# Patient Record
Sex: Female | Born: 1955 | Race: Black or African American | Hispanic: No | Smoking: Never smoker
Health system: Southern US, Community
[De-identification: ages and names within clinical notes are randomized; demographics above are authoritative.]

## PROBLEM LIST (undated history)

## (undated) DIAGNOSIS — J45909 Unspecified asthma, uncomplicated: Secondary | ICD-10-CM

## (undated) DIAGNOSIS — M1990 Unspecified osteoarthritis, unspecified site: Secondary | ICD-10-CM

## (undated) DIAGNOSIS — K5790 Diverticulosis of intestine, part unspecified, without perforation or abscess without bleeding: Secondary | ICD-10-CM

## (undated) DIAGNOSIS — K219 Gastro-esophageal reflux disease without esophagitis: Secondary | ICD-10-CM

## (undated) DIAGNOSIS — D649 Anemia, unspecified: Secondary | ICD-10-CM

## (undated) DIAGNOSIS — I10 Essential (primary) hypertension: Secondary | ICD-10-CM

## (undated) DIAGNOSIS — E785 Hyperlipidemia, unspecified: Secondary | ICD-10-CM

## (undated) HISTORY — DX: Hyperlipidemia, unspecified: E78.5

## (undated) HISTORY — DX: Unspecified osteoarthritis, unspecified site: M19.90

## (undated) HISTORY — DX: Diverticulosis of intestine, part unspecified, without perforation or abscess without bleeding: K57.90

## (undated) HISTORY — DX: Morbid (severe) obesity due to excess calories: E66.01

## (undated) HISTORY — DX: Anemia, unspecified: D64.9

## (undated) HISTORY — PX: WISDOM TOOTH EXTRACTION: SHX21

## (undated) HISTORY — DX: Gastro-esophageal reflux disease without esophagitis: K21.9

## (undated) HISTORY — PX: OTHER SURGICAL HISTORY: SHX169

---

## 1997-10-06 ENCOUNTER — Emergency Department (HOSPITAL_COMMUNITY): Admission: EM | Admit: 1997-10-06 | Discharge: 1997-10-06 | Payer: Self-pay | Admitting: Emergency Medicine

## 1997-12-07 ENCOUNTER — Encounter: Payer: Self-pay | Admitting: Internal Medicine

## 1997-12-07 ENCOUNTER — Emergency Department (HOSPITAL_COMMUNITY): Admission: EM | Admit: 1997-12-07 | Discharge: 1997-12-07 | Payer: Self-pay | Admitting: Internal Medicine

## 1997-12-08 ENCOUNTER — Ambulatory Visit (HOSPITAL_COMMUNITY): Admission: RE | Admit: 1997-12-08 | Discharge: 1997-12-08 | Payer: Self-pay | Admitting: Emergency Medicine

## 1997-12-08 ENCOUNTER — Encounter: Payer: Self-pay | Admitting: Emergency Medicine

## 1998-05-29 ENCOUNTER — Emergency Department (HOSPITAL_COMMUNITY): Admission: EM | Admit: 1998-05-29 | Discharge: 1998-05-29 | Payer: Self-pay | Admitting: Emergency Medicine

## 1999-08-20 ENCOUNTER — Emergency Department (HOSPITAL_COMMUNITY): Admission: EM | Admit: 1999-08-20 | Discharge: 1999-08-20 | Payer: Self-pay | Admitting: Emergency Medicine

## 1999-08-30 ENCOUNTER — Emergency Department (HOSPITAL_COMMUNITY): Admission: EM | Admit: 1999-08-30 | Discharge: 1999-08-30 | Payer: Self-pay | Admitting: Emergency Medicine

## 1999-10-19 ENCOUNTER — Emergency Department (HOSPITAL_COMMUNITY): Admission: EM | Admit: 1999-10-19 | Discharge: 1999-10-19 | Payer: Self-pay | Admitting: Emergency Medicine

## 1999-11-03 ENCOUNTER — Emergency Department (HOSPITAL_COMMUNITY): Admission: EM | Admit: 1999-11-03 | Discharge: 1999-11-04 | Payer: Self-pay | Admitting: Emergency Medicine

## 2000-05-07 ENCOUNTER — Encounter: Payer: Self-pay | Admitting: Emergency Medicine

## 2000-05-07 ENCOUNTER — Emergency Department (HOSPITAL_COMMUNITY): Admission: EM | Admit: 2000-05-07 | Discharge: 2000-05-07 | Payer: Self-pay | Admitting: Emergency Medicine

## 2000-05-08 ENCOUNTER — Emergency Department (HOSPITAL_COMMUNITY): Admission: EM | Admit: 2000-05-08 | Discharge: 2000-05-08 | Payer: Self-pay | Admitting: Emergency Medicine

## 2001-05-15 ENCOUNTER — Emergency Department (HOSPITAL_COMMUNITY): Admission: EM | Admit: 2001-05-15 | Discharge: 2001-05-15 | Payer: Self-pay | Admitting: Emergency Medicine

## 2001-05-15 ENCOUNTER — Encounter: Payer: Self-pay | Admitting: Emergency Medicine

## 2001-07-16 ENCOUNTER — Emergency Department (HOSPITAL_COMMUNITY): Admission: EM | Admit: 2001-07-16 | Discharge: 2001-07-16 | Payer: Self-pay

## 2001-07-16 ENCOUNTER — Encounter: Payer: Self-pay | Admitting: Emergency Medicine

## 2001-08-24 ENCOUNTER — Emergency Department (HOSPITAL_COMMUNITY): Admission: EM | Admit: 2001-08-24 | Discharge: 2001-08-24 | Payer: Self-pay | Admitting: Emergency Medicine

## 2003-03-10 ENCOUNTER — Emergency Department (HOSPITAL_COMMUNITY): Admission: EM | Admit: 2003-03-10 | Discharge: 2003-03-10 | Payer: Self-pay | Admitting: *Deleted

## 2003-07-23 ENCOUNTER — Emergency Department (HOSPITAL_COMMUNITY): Admission: EM | Admit: 2003-07-23 | Discharge: 2003-07-23 | Payer: Self-pay | Admitting: Family Medicine

## 2004-08-01 ENCOUNTER — Ambulatory Visit (HOSPITAL_COMMUNITY): Admission: RE | Admit: 2004-08-01 | Discharge: 2004-08-01 | Payer: Self-pay | Admitting: *Deleted

## 2004-08-01 ENCOUNTER — Encounter (INDEPENDENT_AMBULATORY_CARE_PROVIDER_SITE_OTHER): Payer: Self-pay | Admitting: Specialist

## 2004-10-03 ENCOUNTER — Inpatient Hospital Stay (HOSPITAL_COMMUNITY): Admission: EM | Admit: 2004-10-03 | Discharge: 2004-10-08 | Payer: Self-pay | Admitting: Emergency Medicine

## 2004-12-19 ENCOUNTER — Emergency Department (HOSPITAL_COMMUNITY): Admission: EM | Admit: 2004-12-19 | Discharge: 2004-12-20 | Payer: Self-pay | Admitting: Emergency Medicine

## 2005-03-29 ENCOUNTER — Emergency Department (HOSPITAL_COMMUNITY): Admission: EM | Admit: 2005-03-29 | Discharge: 2005-03-29 | Payer: Self-pay | Admitting: Family Medicine

## 2005-06-30 ENCOUNTER — Emergency Department (HOSPITAL_COMMUNITY): Admission: EM | Admit: 2005-06-30 | Discharge: 2005-06-30 | Payer: Self-pay | Admitting: Emergency Medicine

## 2005-07-01 ENCOUNTER — Emergency Department (HOSPITAL_COMMUNITY): Admission: EM | Admit: 2005-07-01 | Discharge: 2005-07-01 | Payer: Self-pay | Admitting: Emergency Medicine

## 2005-07-10 ENCOUNTER — Emergency Department (HOSPITAL_COMMUNITY): Admission: EM | Admit: 2005-07-10 | Discharge: 2005-07-10 | Payer: Self-pay | Admitting: Family Medicine

## 2006-08-10 ENCOUNTER — Emergency Department (HOSPITAL_COMMUNITY): Admission: EM | Admit: 2006-08-10 | Discharge: 2006-08-10 | Payer: Self-pay | Admitting: Emergency Medicine

## 2006-08-11 ENCOUNTER — Emergency Department (HOSPITAL_COMMUNITY): Admission: EM | Admit: 2006-08-11 | Discharge: 2006-08-11 | Payer: Self-pay | Admitting: Emergency Medicine

## 2007-03-29 ENCOUNTER — Emergency Department (HOSPITAL_COMMUNITY): Admission: EM | Admit: 2007-03-29 | Discharge: 2007-03-29 | Payer: Self-pay | Admitting: Emergency Medicine

## 2007-06-25 ENCOUNTER — Emergency Department (HOSPITAL_COMMUNITY): Admission: EM | Admit: 2007-06-25 | Discharge: 2007-06-25 | Payer: Self-pay | Admitting: Emergency Medicine

## 2007-07-23 ENCOUNTER — Emergency Department (HOSPITAL_COMMUNITY): Admission: EM | Admit: 2007-07-23 | Discharge: 2007-07-24 | Payer: Self-pay | Admitting: Family Medicine

## 2009-01-05 ENCOUNTER — Emergency Department (HOSPITAL_COMMUNITY): Admission: EM | Admit: 2009-01-05 | Discharge: 2009-01-05 | Payer: Self-pay | Admitting: Emergency Medicine

## 2009-08-07 ENCOUNTER — Observation Stay (HOSPITAL_COMMUNITY): Admission: EM | Admit: 2009-08-07 | Discharge: 2009-08-08 | Payer: Self-pay | Admitting: Emergency Medicine

## 2010-02-06 ENCOUNTER — Emergency Department (HOSPITAL_COMMUNITY): Admission: EM | Admit: 2010-02-06 | Discharge: 2010-02-06 | Payer: Self-pay | Admitting: Family Medicine

## 2010-04-09 ENCOUNTER — Encounter: Payer: Self-pay | Admitting: *Deleted

## 2010-05-15 ENCOUNTER — Emergency Department (HOSPITAL_COMMUNITY)
Admission: EM | Admit: 2010-05-15 | Discharge: 2010-05-15 | Disposition: A | Discharge status: Home / Self Care | Payer: Self-pay | Attending: Emergency Medicine | Admitting: Emergency Medicine

## 2010-05-15 DIAGNOSIS — K089 Disorder of teeth and supporting structures, unspecified: Secondary | ICD-10-CM | POA: Insufficient documentation

## 2010-05-15 DIAGNOSIS — K219 Gastro-esophageal reflux disease without esophagitis: Secondary | ICD-10-CM | POA: Insufficient documentation

## 2010-05-15 DIAGNOSIS — K029 Dental caries, unspecified: Secondary | ICD-10-CM | POA: Insufficient documentation

## 2010-05-15 DIAGNOSIS — M549 Dorsalgia, unspecified: Secondary | ICD-10-CM | POA: Insufficient documentation

## 2010-05-15 DIAGNOSIS — G8929 Other chronic pain: Secondary | ICD-10-CM | POA: Insufficient documentation

## 2010-06-04 LAB — DIFFERENTIAL
Basophils Absolute: 0 10*3/uL (ref 0.0–0.1)
Basophils Relative: 0 % (ref 0–1)
Eosinophils Absolute: 0.1 10*3/uL (ref 0.0–0.7)
Eosinophils Relative: 3 % (ref 0–5)
Lymphocytes Relative: 30 % (ref 12–46)
Lymphs Abs: 1.5 10*3/uL (ref 0.7–4.0)
Monocytes Absolute: 0.3 10*3/uL (ref 0.1–1.0)
Monocytes Relative: 6 % (ref 3–12)
Neutro Abs: 2.9 10*3/uL (ref 1.7–7.7)
Neutrophils Relative %: 60 % (ref 43–77)

## 2010-06-04 LAB — URINALYSIS, ROUTINE W REFLEX MICROSCOPIC
Bilirubin Urine: NEGATIVE
Glucose, UA: 100 mg/dL — AB
Hgb urine dipstick: NEGATIVE
Ketones, ur: NEGATIVE mg/dL
Nitrite: NEGATIVE
Protein, ur: NEGATIVE mg/dL
Specific Gravity, Urine: 1.02 (ref 1.005–1.030)
Urobilinogen, UA: 0.2 mg/dL (ref 0.0–1.0)
pH: 8 (ref 5.0–8.0)

## 2010-06-04 LAB — POCT I-STAT, CHEM 8
BUN: 13 mg/dL (ref 6–23)
Calcium, Ion: 1.19 mmol/L (ref 1.12–1.32)
Chloride: 110 mEq/L (ref 96–112)
Creatinine, Ser: 0.7 mg/dL (ref 0.4–1.2)
Glucose, Bld: 56 mg/dL — ABNORMAL LOW (ref 70–99)
HCT: 34 % — ABNORMAL LOW (ref 36.0–46.0)
Hemoglobin: 11.6 g/dL — ABNORMAL LOW (ref 12.0–15.0)
Potassium: 3.6 mEq/L (ref 3.5–5.1)
Sodium: 141 mEq/L (ref 135–145)
TCO2: 22 mmol/L (ref 0–100)

## 2010-06-04 LAB — CBC
HCT: 34.3 % — ABNORMAL LOW (ref 36.0–46.0)
Hemoglobin: 10.9 g/dL — ABNORMAL LOW (ref 12.0–15.0)
MCHC: 31.8 g/dL (ref 30.0–36.0)
MCV: 86.5 fL (ref 78.0–100.0)
Platelets: 225 10*3/uL (ref 150–400)
Platelets: 254 10*3/uL (ref 150–400)
RBC: 3.97 MIL/uL (ref 3.87–5.11)
RDW: 14.3 % (ref 11.5–15.5)
RDW: 14.4 % (ref 11.5–15.5)
WBC: 4.2 10*3/uL (ref 4.0–10.5)
WBC: 4.9 10*3/uL (ref 4.0–10.5)

## 2010-06-04 LAB — BASIC METABOLIC PANEL
BUN: 9 mg/dL (ref 6–23)
Creatinine, Ser: 0.82 mg/dL (ref 0.4–1.2)
GFR calc non Af Amer: >60 mL/min (ref >60)

## 2010-06-04 LAB — GLUCOSE, CAPILLARY
Glucose-Capillary: 101 mg/dL — ABNORMAL HIGH (ref 70–99)
Glucose-Capillary: 77 mg/dL (ref 70–99)
Glucose-Capillary: 88 mg/dL (ref 70–99)
Glucose-Capillary: 97 mg/dL (ref 70–99)

## 2010-06-04 LAB — RAPID URINE DRUG SCREEN, HOSP PERFORMED
Amphetamines: NOT DETECTED
Barbiturates: NOT DETECTED
Benzodiazepines: NOT DETECTED
Cocaine: NOT DETECTED
Opiates: NOT DETECTED
Tetrahydrocannabinol: NOT DETECTED

## 2010-06-04 LAB — ETHANOL: Alcohol, Ethyl (B): <5 mg/dL (ref 0–10)

## 2010-06-04 LAB — D-DIMER, QUANTITATIVE: D-Dimer, Quant: 0.24 ug/mL-FEU (ref 0.00–0.48)

## 2010-06-04 LAB — T4, FREE: Free T4: 0.73 ng/dL — ABNORMAL LOW (ref 0.80–1.80)

## 2010-08-03 NOTE — Discharge Summary (Signed)
[name], DEA NO.:  [id number]   MEDICAL RECORD NO.:  [id number]          PATIENT TYPE:  INP   LOCATION:  1619                         FACILITY:  [facility]   PHYSICIAN:  Hollice Espy, M.D.DATE OF BIRTH:  [birth year]   DATE OF ADMISSION:  10/02/2004  DATE OF DISCHARGE:  10/08/2004                                 DISCHARGE SUMMARY   DISCHARGE DIAGNOSES:  1.  Diverticulitis.  2.  History of sleep apnea.  3.  Vaginal spotting with questionable bilateral hydrosalpinx, left greater      than right.   Patient's PCP is at [facility]; however, she plans on changing doctors to  an insurance-based primary care physician.   HOSPITAL COURSE:  Patient is a 55 year old African-American female with a  past medical history of diverticulosis and diverticulitis, who presents to  the emergency room on October 02, 2004 with abdominal pain.  A CT of the  abdomen and pelvis showed sigmoid colon diverticulitis with diffuse low-  density wall thickening and stranding consistent with that.  In addition, in  the anterior aspect of the sigmoid colon, a 1.5 x 0.9 collection of fluid  plus air or gas was noted, left greater than right hydrosalpinx were noted  as well.  A follow-up transvaginal ultrasound was recommended once the  patient's GI issues had resolved.  The patient was made n.p.o., started on  IV Cipro and Flagyl.  She tolerated this well.  Initially had been having  lots of abdominal pain and distention but after placement of a rectal tube,  this greatly improved her distention and symptoms.  She was made n.p.o., and  her pain continued to decrease.  Her white count remained stable throughout  her entire hospitalization.   On October 05, 2004, the patient was feeling better.  CT of the abdomen and  pelvis was done, which showed improved diverticulitis with abscess.  She was  started on clear liquids on the evening of July 22.  She tried to advance to  solid foods on July  23 but started having some abdominal pain that was  relieved with Maalox.  By July 24 a.m., she was tolerating solid food  without complaint.  The plan would be to discharge her to home.   DISPOSITION:  Improved.   DISCHARGE DIET:  Regular diet.   Continue on eight more days of Cipro and Flagyl for a total of 14 days of  both.  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  In regards to her GU issues, she was  complaining of some abdominal distention initially, which she thought may  also be related to her bladder.  She had a Foley catheter in, which did help  some.  She had no complaints of dysuria but was concerned about uterine  fibroids.  CT of the abdomen and pelvis was done, as  mentioned in the initial presentation, which she noted some bilateral  hydrosalpinx.  Given the patient's continued diverticulitis  and the fact  that her pain had not resolved, we will plan to complete a full course of  antibiotics before followup with GU and an outpatient transvaginal  ultrasound.  The patient is okay to return to work.       SKK/MEDQ  D:  10/08/2004  T:  10/08/2004  Job:  [id number]   cc:   Althea Grimmer. Luther Parody, M.D.  [address], [address]  [place]  [place] [address]  Fax: [phone]   [facility]

## 2010-08-03 NOTE — H&P (Signed)
Olivia Gutierrez, BARUA NO.:  [id number]   MEDICAL RECORD NO.:  [id number]          PATIENT TYPE:  INP   LOCATION:  0108                         FACILITY:  [facility]   PHYSICIAN:  Melissa L. Ladona Ridgel, MD  DATE OF BIRTH:  1955/09/07   DATE OF ADMISSION:  10/02/2004  DATE OF DISCHARGE:                                HISTORY & PHYSICAL   CHIEF COMPLAINT:  Four days of abdominal pain and vaginal pain.   PRIMARY CARE PHYSICIAN:  [facility].   HISTORY OF PRESENT ILLNESS:  The patient is a 55 year old African-American  female who developed bloating 4 days ago which progressed on to not being  able to move her bowels, mild dysuria, followed then by some vaginal  spotting, which has not occurred to her for many years.  The patient states  that her last menstrual cycle was at the age of 55, and since then has been  told that she has a hormone problem and has not cycled nor seen a  gynecologist in greater than 12 years.  The patient states that she has  subsequently been unable to move her bowels, and the pain in her abdomen has  been getting progressively worse.  It is bilaterally located in the lower  quadrants and in the suprapubic area, and generally at baseline it is 8/10;  currently it has been relieved by pain medication, and is down to a 1-2/10,  but she still feels quite bloated.  The patient states that in response to  her symptoms, she was watching her progression very closely.  She states she  took a laxative, namely milk of magnesia, which generally resulted in nausea  and vomiting.  Today is the first day that she actually moved her bowels a  very small amount.  She denied any melena or hematochezia.   REVIEW OF SYSTEMS:  She did have a low-grade temperature, positive dysuria,  no hematemesis, no melena.  Her appetite up until this time has been good.  She has had no weight loss or weight gain.  All other review of systems  appear negative.   PAST MEDICAL  HISTORY:  She had a previous bout of diverticulitis 4 months  ago.  She was seen by Dr. Luther Parody in May, and had an EGD, colonoscopy.  Biopsies were negative for any abnormality.  A full report is not available  at this time.  The patient states, again, she stopped her menses at 20, and  has not had any subsequent GYN follow up in approximately 12 years.   PAST SURGICAL HISTORY:  None.   ALLERGIES:  PENICILLIN which caused hives.   SOCIAL HISTORY:  She denies tobacco.  She has occasional wine.  She is a  caregiver for the disabled.   FAMILY HISTORY:  Mom is deceased.  Dad is still living.  Both sides of their  family have hypertension and CAD.  Her sister is deceased secondary to  hepatitis, cirrhosis, and cancer of the liver.   MEDICATIONS AT THIS TIME:  None.   PHYSICAL EXAMINATION:  VITAL SIGNS:  Temperature is 98.3, blood pressure  111/64, pulse 64, respirations 12, saturation 95%.  GENERAL:  She is in no acute distress.  She is an obese African-American  female.  HEENT:  Normocephalic and atraumatic.  Pupils equal, round and reactive to  light.  Extraocular muscles are intact.  Mucous membranes are moist.  NECK:  Supple.  There is no JVD, no lymph nodes, no carotid bruits.  She has  mild bilateral exophthalmos.  She is anicteric.  CHEST:  Clear to auscultation, but decreased bilaterally.  CARDIOVASCULAR:  Regular rate and rhythm.  Positive S1 and S2.  No S3 or S4.  Normal murmurs, rubs, or gallops.  ABDOMEN:  Soft.  Positive tenderness diffusely, but with concentration in  the left lower quadrant, there is no guarding or rebound.  EXTREMITIES:  No clubbing, cyanosis, or edema.  NEUROLOGIC:  She is awake, alert, oriented x3.  Cranial nerves II-XII are  intact.  Power is 5/5.  Deep tendon reflexes are 2.   LABORATORY DATA:  White count of 8.9, hemoglobin 11.4, hematocrit 33.0,  platelets 286.  Sodium is 139, potassium 4.3, chloride 107, CO2 of 26, BUN  11, creatinine 1.0,  glucose is 103.  Urinalysis is negative.  Wet prep  showed no yeast, no Trichomonas, and no clue cells.  Urine pregnancy is  negative.  LFT's are within normal limits.  CT of the abdomen reveals  colonic diverticulosis without diverticulitis.  She has a large sigmoid  colon diverticula with low density wall thickening and stranding consistent  with diverticulitis.  The anterior aspect of the sigmoid colon reveals a 1.5  x 0.9 cm collection of fluid which contains either air or gas.  There is a  left greater than right hydrosalpinx.  Follow up vaginal ultrasound is  warranted.   ASSESSMENT AND PLAN:  This is a 55 year old African-American female with  recurrent sigmoid diverticulitis with contained perforation.  Will admit her  for antibiotics, IV hydration, and possible surgical consultation in the  a.m.  1.  GASTROINTESTINAL:  Diverticulitis with small contained      abscess/perforation.  I agree with continuation of Cipro and Flagyl.      Will do pain control with Dilaudid.  Will hydrate her gently.  DrMarland Kitchen      Luther Parody is listed as having done her colonoscopy and EGD in May.  We      will attempt to obtain that information in the morning.  2.  GYNECOLOGIC:  Urinalysis is negative.  She has had mild spotting.  Her      vaginal exam appears negative at this time.  We will recommend a vaginal      ultrasound when she is more stable.  She also needs GYN follow up.  3.  ENDOCRINOLOGY.  She has mild exophthalmos.  Will check a TSH.  4.  PULMONARY:  Stable with no issues.  5.  CARDIOVASCULAR:  Stable.  We will gently hydrate her.       MLT/MEDQ  D:  10/03/2004  T:  10/03/2004  Job:  [id number]   cc:   Donia Guiles, M.D.  [facility]   Althea Grimmer. Luther Parody, M.D.  [address]  [place]  [place] [address]  Fax: [phone]

## 2010-08-17 ENCOUNTER — Emergency Department (HOSPITAL_COMMUNITY)
Admission: EM | Admit: 2010-08-17 | Discharge: 2010-08-17 | Disposition: A | Discharge status: Home / Self Care | Payer: Self-pay | Attending: Emergency Medicine | Admitting: Emergency Medicine

## 2010-08-17 DIAGNOSIS — K089 Disorder of teeth and supporting structures, unspecified: Secondary | ICD-10-CM | POA: Insufficient documentation

## 2010-08-17 DIAGNOSIS — K047 Periapical abscess without sinus: Secondary | ICD-10-CM | POA: Insufficient documentation

## 2010-08-17 DIAGNOSIS — R51 Headache: Secondary | ICD-10-CM | POA: Insufficient documentation

## 2010-12-11 LAB — URINALYSIS, ROUTINE W REFLEX MICROSCOPIC
Bilirubin Urine: NEGATIVE
Glucose, UA: NEGATIVE
Ketones, ur: NEGATIVE
Leukocytes, UA: NEGATIVE
Specific Gravity, Urine: 1.025
pH: 6

## 2010-12-11 LAB — DIFFERENTIAL
Basophils Absolute: 0
Eosinophils Relative: 4
Lymphocytes Relative: 21
Lymphs Abs: 1.2
Neutro Abs: 4.1
Neutrophils Relative %: 72

## 2010-12-11 LAB — URINE MICROSCOPIC-ADD ON

## 2010-12-11 LAB — BASIC METABOLIC PANEL
BUN: 14
Calcium: 9.2
GFR calc non Af Amer: >60
Potassium: 4.3

## 2010-12-11 LAB — CBC
HCT: 33.2 — ABNORMAL LOW
Platelets: 274
RDW: 14.7
WBC: 5.7

## 2010-12-12 LAB — COMPREHENSIVE METABOLIC PANEL
Albumin: 3.5
Alkaline Phosphatase: 83
BUN: 12
CO2: 25
Chloride: 101
Creatinine, Ser: 0.83
GFR calc non Af Amer: >60
Potassium: 4.2
Total Bilirubin: 0.4

## 2010-12-12 LAB — DIFFERENTIAL
Basophils Absolute: 0
Basophils Relative: 0
Eosinophils Relative: 4
Lymphocytes Relative: 25
Monocytes Absolute: 0.4
Neutro Abs: 4.7

## 2010-12-12 LAB — CBC
HCT: 35.2 — ABNORMAL LOW
Hemoglobin: 11.7 — ABNORMAL LOW
MCV: 85.6
RBC: 4.11
WBC: 7.2

## 2010-12-12 LAB — URINALYSIS, ROUTINE W REFLEX MICROSCOPIC
Bilirubin Urine: NEGATIVE
Ketones, ur: NEGATIVE
Nitrite: NEGATIVE
Specific Gravity, Urine: 1.027
Urobilinogen, UA: 0.2
pH: 5.5

## 2011-01-23 ENCOUNTER — Emergency Department (INDEPENDENT_AMBULATORY_CARE_PROVIDER_SITE_OTHER)
Admission: EM | Admit: 2011-01-23 | Discharge: 2011-01-23 | Disposition: A | Discharge status: Home / Self Care | Payer: PRIVATE HEALTH INSURANCE | Source: Home / Self Care | Attending: Family Medicine | Admitting: Family Medicine

## 2011-01-23 DIAGNOSIS — S39012A Strain of muscle, fascia and tendon of lower back, initial encounter: Secondary | ICD-10-CM

## 2011-01-23 DIAGNOSIS — M533 Sacrococcygeal disorders, not elsewhere classified: Secondary | ICD-10-CM

## 2011-01-23 DIAGNOSIS — M545 Low back pain, unspecified: Secondary | ICD-10-CM

## 2011-01-23 DIAGNOSIS — IMO0002 Reserved for concepts with insufficient information to code with codable children: Secondary | ICD-10-CM

## 2011-01-23 MED ORDER — CYCLOBENZAPRINE HCL 5 MG PO TABS: 5.0000 mg | ORAL_TABLET | Freq: Three times a day (TID) | ORAL | Status: AC | PRN

## 2011-01-23 MED ORDER — PREDNISONE (PAK) 10 MG PO TABS: ORAL_TABLET | ORAL | Status: DC

## 2011-01-23 MED ORDER — HYDROCODONE-ACETAMINOPHEN 5-325 MG PO TABS: ORAL_TABLET | ORAL | Status: AC

## 2011-01-23 NOTE — ED Notes (Signed)
Pt states she has been trying to work out some, lifting weights, and 2 days ago, she had some a low level lift  While helping someone w their bath, when she felt something "snap" since then , she has had a lot of problems w pain in her tailbone area w radiation into both of her  buttocks cheeks; the only thing that helps her is to not move at all

## 2011-01-23 NOTE — ED Provider Notes (Signed)
History     CSN: [id number] Arrival date & time: 01/23/2011  1:46 PM   First MD Initiated Contact with Patient 01/23/11 1350      Chief Complaint  Patient presents with  . Back Pain    (Consider location/radiation/quality/duration/timing/severity/associated sxs/prior treatment) Patient is a 55 y.o. female presenting with back pain. The history is provided by the patient.  Back Pain  This is a new problem. The current episode started more than 2 days ago. The problem occurs constantly. The problem has not changed since onset.The pain is associated with lifting heavy objects. The pain is present in the lumbar spine and sacro-iliac joint. The quality of the pain is described as shooting. The pain does not radiate. Pertinent negatives include no abdominal pain, no bowel incontinence, no bladder incontinence and no paresthesias. She has tried analgesics, NSAIDs and bed rest for the symptoms. The treatment provided no relief. Risk factors include obesity and a sedentary lifestyle.    History reviewed. No pertinent past medical history.  Past Surgical History  Procedure Date  . None     History reviewed. No pertinent family history.  History  Substance Use Topics  . Smoking status: Never Smoker   . Smokeless tobacco: Not on file  . Alcohol Use: No    OB History    Grav Para Term Preterm Abortions TAB SAB Ect Mult Living                  Review of Systems  Constitutional: Negative.   HENT: Negative.   Eyes: Negative.   Gastrointestinal: Negative for abdominal pain and bowel incontinence.  Genitourinary: Negative.  Negative for bladder incontinence.  Musculoskeletal: Positive for back pain.  Neurological: Negative.  Negative for paresthesias.    Allergies  Penicillins  Home Medications  No current outpatient prescriptions on file.  BP 147/79  Pulse 52  Temp(Src) 98 F (36.7 C) (Oral)  Resp 16  SpO2 100%  Physical Exam  Constitutional: She appears well-developed  and well-nourished.  HENT:  Head: Normocephalic and atraumatic.  Musculoskeletal:       Lumbar back: She exhibits decreased range of motion, tenderness and pain. She exhibits no bony tenderness.       Back: negative straight leg raise bilaterally, negative Fabers bilaterally, no pain with internal or external rotation bilaterally, full flexion, extension, abduction, and adduction; 5/5 strength    ED Course  Procedures (including critical care time)  Labs Reviewed - No data to display No results found.   No diagnosis found.    MDM  SI joint pain, low back pain, muscle strain        Richardo Priest, MD 01/23/11 1506

## 2011-12-12 ENCOUNTER — Emergency Department (INDEPENDENT_AMBULATORY_CARE_PROVIDER_SITE_OTHER)
Admission: EM | Admit: 2011-12-12 | Discharge: 2011-12-12 | Disposition: A | Discharge status: Home / Self Care | Payer: Self-pay | Source: Home / Self Care | Attending: Emergency Medicine | Admitting: Emergency Medicine

## 2011-12-12 ENCOUNTER — Emergency Department (INDEPENDENT_AMBULATORY_CARE_PROVIDER_SITE_OTHER): Payer: Self-pay

## 2011-12-12 ENCOUNTER — Encounter (HOSPITAL_COMMUNITY): Payer: Self-pay | Admitting: *Deleted

## 2011-12-12 DIAGNOSIS — J209 Acute bronchitis, unspecified: Secondary | ICD-10-CM

## 2011-12-12 DIAGNOSIS — J45909 Unspecified asthma, uncomplicated: Secondary | ICD-10-CM

## 2011-12-12 DIAGNOSIS — I10 Essential (primary) hypertension: Secondary | ICD-10-CM

## 2011-12-12 DIAGNOSIS — J020 Streptococcal pharyngitis: Secondary | ICD-10-CM

## 2011-12-12 MED ORDER — GUAIFENESIN-CODEINE 100-10 MG/5ML PO SYRP: 10.0000 mL | ORAL_SOLUTION | Freq: Four times a day (QID) | ORAL | Status: DC | PRN

## 2011-12-12 MED ORDER — PREDNISONE 5 MG PO KIT: 1.0000 | PACK | Freq: Every day | ORAL | Status: DC

## 2011-12-12 MED ORDER — AZITHROMYCIN 500 MG PO TABS: 500.0000 mg | ORAL_TABLET | Freq: Every day | ORAL | Status: DC

## 2011-12-12 MED ORDER — HYDROCHLOROTHIAZIDE 25 MG PO TABS: 25.0000 mg | ORAL_TABLET | Freq: Every day | ORAL | Status: DC

## 2011-12-12 MED ORDER — ALBUTEROL SULFATE HFA 108 (90 BASE) MCG/ACT IN AERS: 1.0000 | INHALATION_SPRAY | Freq: Four times a day (QID) | RESPIRATORY_TRACT | Status: DC | PRN

## 2011-12-12 NOTE — ED Provider Notes (Signed)
Chief Complaint  Patient presents with  . URI    History of Present Illness:   Olivia Gutierrez is a 56 year old female who has had a two-day history of cough productive yellow sputum, chest tightness and wheezing. She has had a history of asthma since she was a teenager and uses as needed albuterol which she uses infrequently. She also complains of headache, fever, chills, nasal congestion with yellow drainage, ear congestion, sinus pressure, and nausea. She denies any sore throat. She is allergic to penicillin. She has been exposed to another student at school who was diagnosed with bronchitis.  Review of Systems:  Other than noted above, the patient denies any of the following symptoms. Systemic:  No fever, chills, sweats, fatigue, myalgias, headache, or anorexia. Eye:  No redness, pain or drainage. ENT:  No earache, ear congestion, nasal congestion, sneezing, rhinorrhea, sinus pressure, sinus pain, post nasal drip, or sore throat. Lungs:  No cough, sputum production, wheezing, shortness of breath, or chest pain. GI:  No abdominal pain, nausea, vomiting, or diarrhea.  PMFSH:  Past medical history, family history, social history, meds, and allergies were reviewed.  Physical Exam:   Vital signs:  BP 150/97  Pulse 72  Temp 99.5 F (37.5 C) (Oral)  Resp 16  SpO2 97% General:  Alert, in no distress. Eye:  No conjunctival injection or drainage. Lids were normal. ENT:  TMs and canals were normal, without erythema or inflammation.  Nasal mucosa was clear and uncongested, without drainage.  Mucous membranes were moist.  Pharynx was clear, without exudate or drainage.  There were no oral ulcerations or lesions. Neck:  Supple, no adenopathy, tenderness or mass. Lungs:  No respiratory distress.  Lungs were clear to auscultation, without wheezes, rales or rhonchi.  Breath sounds were clear and equal bilaterally.  Heart:  Regular rhythm, without gallops, murmers or rubs. Skin:  Clear, warm, and dry, without  rash or lesions.  Labs:   Results for orders placed during the hospital encounter of 12/12/11  POCT RAPID STREP A ([facility]RG CARE ONLY)      Component Value Range   Streptococcus, Group A Screen (Direct) POSITIVE (*) NEGATIVE    Radiology:  Dg Chest 2 View  12/12/2011  *RADIOLOGY REPORT*  Clinical Data: 56 year old female with cough fever chills vomiting.  CHEST - 2 VIEW  Comparison: 08/07/2009 and earlier.  Findings: Stable cardiomegaly and mediastinal contours.  Lung volumes are within normal limits.  No pneumothorax, pulmonary edema, pleural effusion or confluent pulmonary opacity. Visualized tracheal air column is within normal limits.  No acute osseous abnormality identified.  IMPRESSION: No acute cardiopulmonary abnormality.   Original Report Authenticated By: Harley Hallmark, M.D.    I reviewed the images independently and personally and concur with the radiologist's findings.  Assessment:  The primary encounter diagnosis was Acute bronchitis. Diagnoses of Asthma, Hypertension, and Strep throat were also pertinent to this visit.  Her blood pressure is elevated today. She states this has been elevated in the past and she feels she needs something for her blood pressure. The physician at the [facility] student health is reluctant to prescribe blood pressure medication for her. I went ahead and gave her a prescription for hydrochlorothiazide and told to followup with a primary care physician elsewhere.  Plan:   1.  The following meds were prescribed:   New Prescriptions   ALBUTEROL (PROVENTIL HFA;VENTOLIN HFA) 108 (90 BASE) MCG/ACT INHALER    Inhale 1-2 puffs into the lungs every 6 (six) hours as  needed for wheezing.   AZITHROMYCIN (ZITHROMAX) 500 MG TABLET    Take 1 tablet (500 mg total) by mouth daily.   GUAIFENESIN-CODEINE (GUIATUSS AC) 100-10 MG/5ML SYRUP    Take 10 mLs by mouth 4 (four) times daily as needed for cough.   HYDROCHLOROTHIAZIDE (HYDRODIURIL) 25 MG TABLET    Take 1 tablet (25  mg total) by mouth daily.   PREDNISONE 5 MG KIT    Take 1 kit (5 mg total) by mouth daily after breakfast. Prednisone 5 mg 6 day dosepack.  Take as directed.   2.  The patient was instructed in symptomatic care and handouts were given. 3.  The patient was told to return if becoming worse in any way, if no better in 3 or 4 days, and given some red flag symptoms that would indicate earlier return.   Reuben Likes, MD 12/12/11 [id number]

## 2011-12-12 NOTE — ED Notes (Signed)
Pt reports cough,congestion, fever, chills over the past few days

## 2012-03-17 ENCOUNTER — Other Ambulatory Visit: Payer: Self-pay

## 2012-03-17 ENCOUNTER — Encounter (HOSPITAL_COMMUNITY): Payer: Self-pay | Admitting: Emergency Medicine

## 2012-03-17 ENCOUNTER — Emergency Department (HOSPITAL_COMMUNITY)
Admission: EM | Admit: 2012-03-17 | Discharge: 2012-03-17 | Disposition: A | Discharge status: Home / Self Care | Payer: Self-pay | Attending: Emergency Medicine | Admitting: Emergency Medicine

## 2012-03-17 ENCOUNTER — Encounter (HOSPITAL_COMMUNITY): Payer: Self-pay | Admitting: *Deleted

## 2012-03-17 ENCOUNTER — Emergency Department (HOSPITAL_COMMUNITY): Payer: Self-pay

## 2012-03-17 ENCOUNTER — Emergency Department (INDEPENDENT_AMBULATORY_CARE_PROVIDER_SITE_OTHER)
Admission: EM | Admit: 2012-03-17 | Discharge: 2012-03-17 | Disposition: A | Discharge status: Nursing Home (Includes ICF) | Payer: Self-pay | Source: Home / Self Care

## 2012-03-17 DIAGNOSIS — Z79899 Other long term (current) drug therapy: Secondary | ICD-10-CM | POA: Insufficient documentation

## 2012-03-17 DIAGNOSIS — R002 Palpitations: Secondary | ICD-10-CM

## 2012-03-17 DIAGNOSIS — F411 Generalized anxiety disorder: Secondary | ICD-10-CM | POA: Insufficient documentation

## 2012-03-17 DIAGNOSIS — J45909 Unspecified asthma, uncomplicated: Secondary | ICD-10-CM | POA: Insufficient documentation

## 2012-03-17 DIAGNOSIS — R450 Nervousness: Secondary | ICD-10-CM | POA: Insufficient documentation

## 2012-03-17 DIAGNOSIS — I491 Atrial premature depolarization: Secondary | ICD-10-CM

## 2012-03-17 DIAGNOSIS — R5381 Other malaise: Secondary | ICD-10-CM | POA: Insufficient documentation

## 2012-03-17 DIAGNOSIS — I10 Essential (primary) hypertension: Secondary | ICD-10-CM | POA: Insufficient documentation

## 2012-03-17 DIAGNOSIS — M7989 Other specified soft tissue disorders: Secondary | ICD-10-CM | POA: Insufficient documentation

## 2012-03-17 HISTORY — DX: Unspecified asthma, uncomplicated: J45.909

## 2012-03-17 HISTORY — DX: Essential (primary) hypertension: I10

## 2012-03-17 LAB — CBC
HCT: 35.7 % — ABNORMAL LOW (ref 36.0–46.0)
Hemoglobin: 11.5 g/dL — ABNORMAL LOW (ref 12.0–15.0)
MCH: 27.3 pg (ref 26.0–34.0)
MCHC: 32.2 g/dL (ref 30.0–36.0)
MCV: 84.8 fL (ref 78.0–100.0)
Platelets: 253 10*3/uL (ref 150–400)
RBC: 4.21 MIL/uL (ref 3.87–5.11)
RDW: 14.5 % (ref 11.5–15.5)
WBC: 4.7 K/uL (ref 4.0–10.5)

## 2012-03-17 LAB — URINALYSIS, ROUTINE W REFLEX MICROSCOPIC
Bilirubin Urine: NEGATIVE
Glucose, UA: NEGATIVE mg/dL
Hgb urine dipstick: NEGATIVE
Ketones, ur: NEGATIVE mg/dL
Leukocytes, UA: NEGATIVE
Nitrite: NEGATIVE
Protein, ur: NEGATIVE mg/dL
Specific Gravity, Urine: 1.023 (ref 1.005–1.030)
Urobilinogen, UA: 0.2 mg/dL (ref 0.0–1.0)
pH: 7.5 (ref 5.0–8.0)

## 2012-03-17 LAB — COMPREHENSIVE METABOLIC PANEL
Alkaline Phosphatase: 82 U/L (ref 39–117)
BUN: 16 mg/dL (ref 6–23)
Calcium: 9.5 mg/dL (ref 8.4–10.5)
Creatinine, Ser: 0.85 mg/dL (ref 0.50–1.10)
GFR calc Af Amer: 88 mL/min — ABNORMAL LOW (ref >90)
Glucose, Bld: 94 mg/dL (ref 70–99)
Potassium: 4 mEq/L (ref 3.5–5.1)
Total Protein: 6.7 g/dL (ref 6.0–8.3)

## 2012-03-17 LAB — PRO B NATRIURETIC PEPTIDE: Pro B Natriuretic peptide (BNP): 48.5 pg/mL (ref 0–125)

## 2012-03-17 LAB — POCT I-STAT TROPONIN I: Troponin i, poc: 0.01 ng/mL (ref 0.00–0.08)

## 2012-03-17 LAB — COMPREHENSIVE METABOLIC PANEL WITH GFR
ALT: 13 U/L (ref 0–35)
AST: 15 U/L (ref 0–37)
Albumin: 3.5 g/dL (ref 3.5–5.2)
CO2: 26 meq/L (ref 19–32)
Chloride: 104 meq/L (ref 96–112)
GFR calc non Af Amer: 76 mL/min — ABNORMAL LOW (ref >90)
Sodium: 139 meq/L (ref 135–145)
Total Bilirubin: 0.3 mg/dL (ref 0.3–1.2)

## 2012-03-17 NOTE — ED Notes (Signed)
Pt c/o heart palpitations. Chest heaviness with sob. Fatigue. Symptoms present since the being of the month and are gradually getting worse. Pt has tried rest with only mild relief. Denies chest pain but is having a discomfort.

## 2012-03-17 NOTE — ED Notes (Signed)
Pt reports bilateral ankle swelling.  Pt has shortness of breath with walking and reclining.  Pt has been on long travel to [place] and back over the holidays.  Siblings died of heart attack at 5 and 33 and her twin sister has had 3 mi's already.

## 2012-03-17 NOTE — ED Provider Notes (Signed)
History     CSN: [id number]  Arrival date & time 03/17/12  1304   First MD Initiated Contact with Patient 03/17/12 1640      Chief Complaint  Patient presents with  . Palpitations    (Consider location/radiation/quality/duration/timing/severity/associated sxs/prior treatment) HPI Comments: Patient reports that for several weeks she has been under more stress with several family members who have passed away recently including her sister. Patient reports that she has had palpitations in the past that she saw Dr. Allyson Sabal for many years ago. She had a workup about 7 years ago with echocardiogram that was otherwise unremarkable. She reports that she has had episodes of shortness of breath but not persistently. She denies any significant chest tightness or pressure. She endorses some fatigue. She does admit with her palpitations she hasn't been sleeping or eating well. She reports her palpitations are as described as a cramping and fluttering in her chest. It is not a rapid heartbeat. She denies any lower extremity swelling or calf tenderness. She denies pleuritic pain. Symptoms have been ongoing for a few weeks and worse the last couple days. She has not taken any specific medications for her symptoms nor spoken to [facility] cardiology as she has not seen him in several years. She does have a history of asthma and has been using her inhaler as usual without any significant change to her palpitations.  The history is provided by the patient and the spouse.    Past Medical History  Diagnosis Date  . Asthma   . Hypertension     Past Surgical History  Procedure Date  . None     Family History  Problem Relation Age of Onset  . Heart attack Mother   . Heart attack Father   . Heart failure Sister   . Heart attack Sister     History  Substance Use Topics  . Smoking status: Never Smoker   . Smokeless tobacco: Not on file  . Alcohol Use: No    OB History    Grav Para Term Preterm  Abortions TAB SAB Ect Mult Living                  Review of Systems  Constitutional: Positive for fatigue. Negative for fever.  HENT: Negative for congestion and rhinorrhea.   Cardiovascular: Positive for palpitations. Negative for chest pain and leg swelling.  Gastrointestinal: Negative for nausea, vomiting and abdominal pain.  Musculoskeletal: Negative for back pain.  Skin: Negative for color change.  Neurological: Negative for dizziness, light-headedness and headaches.  Psychiatric/Behavioral: The patient is nervous/anxious.   All other systems reviewed and are negative.    Allergies  Penicillins  Home Medications   Current Outpatient Rx  Name  Route  Sig  Dispense  Refill  . ALBUTEROL SULFATE HFA 108 (90 BASE) MCG/ACT IN AERS   Inhalation   Inhale 1-2 puffs into the lungs every 6 (six) hours as needed for wheezing.   1 Inhaler   0   . HYDROCHLOROTHIAZIDE 25 MG PO TABS   Oral   Take 1 tablet (25 mg total) by mouth daily.   30 tablet   2   . ADULT MULTIVITAMIN W/MINERALS CH   Oral   Take 1 tablet by mouth daily.           BP 134/82  Pulse 61  Temp 97.7 F (36.5 C) (Oral)  Resp 18  SpO2 100%  Physical Exam  Nursing note and vitals reviewed. Constitutional: She is  oriented to person, place, and time. She appears well-developed and well-nourished. No distress.  HENT:  Head: Normocephalic and atraumatic.  Mouth/Throat: Oropharynx is clear and moist.  Eyes: Pupils are equal, round, and reactive to light.  Neck: Normal range of motion. Neck supple.  Cardiovascular: Normal rate, regular rhythm and normal pulses.  Frequent extrasystoles are present.  No murmur heard. Pulmonary/Chest: Effort normal and breath sounds normal. No respiratory distress. She has no wheezes.  Abdominal: Soft.  Musculoskeletal: She exhibits no edema and no tenderness.  Neurological: She is alert and oriented to person, place, and time. No cranial nerve deficit.  Skin: Skin is warm.  No rash noted. She is not diaphoretic. No erythema.  Psychiatric: She has a normal mood and affect.    ED Course  Procedures (including critical care time)  Labs Reviewed  CBC - Abnormal; Notable for the following:    Hemoglobin 11.5 (*)     HCT 35.7 (*)     All other components within normal limits  COMPREHENSIVE METABOLIC PANEL - Abnormal; Notable for the following:    GFR calc non Af Amer 76 (*)     GFR calc Af Amer 88 (*)     All other components within normal limits  URINALYSIS, ROUTINE W REFLEX MICROSCOPIC - Abnormal; Notable for the following:    APPearance CLOUDY (*)     All other components within normal limits  PRO B NATRIURETIC PEPTIDE  POCT I-STAT TROPONIN I  LAB REPORT - SCANNED   Dg Chest 2 View  03/17/2012  *RADIOLOGY REPORT*  Clinical Data: Palpitations.  CHEST - 2 VIEW  Comparison:  12/12/2011.  Findings: The heart is borderline enlarged.  Mild tortuosity of the thoracic aorta.  Low lung volumes with vascular crowding atelectasis but no infiltrates, edema or effusions.  The bony thorax is intact.  IMPRESSION: Borderline cardiac enlargement. Low lung volumes with vascular crowding and basilar atelectasis.   Original Report Authenticated By: Rudie Meyer, M.D.      1. Supraventricular premature beats      Room air saturation is 98% and I interpret this to be normal. I reviewed the patient's chest x-ray and review the radiologist's interpretation I agree. No acute abnormalities are seen.  EKG performed at time 13:25 shows a sinus rhythm at a rate of 61 with occasional premature supraventricular complexes. Otherwise normal axis, normal intervals, no ST or T-wave abnormalities. MDM   Patient with a few cardiac risk factors however, and negative cardiac evaluation and workup several years past. Patient clearly has multiple premature supraventricular beats both on EKG and cardiac telemetry monitoring. As I feel the patient's pulse, she confirms that what she is feeling  as her palpitations are each of her premature beats. Her lab evaluation reveals no electrolyte abnormalities. She is reassured that her blood tests are okay and that palpitations and general are not significant other than can be clinically symptomatic and bothersome. I suggested that she followup with her primary care physician and/or Dr. Marina Goodell in the near future and consider beta blockade which could help her blood pressure and her palpitations. Patient is in agreement.        Gavin Pound. Oletta Lamas, MD 03/18/12 1308

## 2012-03-17 NOTE — ED Provider Notes (Signed)
History     CSN: [id number]  Arrival date & time 03/17/12  1136   None     Chief Complaint  Patient presents with  . Palpitations    chest heaviness. fatigue. sob. gradually getting worse    (Consider location/radiation/quality/duration/timing/severity/associated sxs/prior treatment) Patient is a 56 y.o. female presenting with palpitations. The history is provided by the patient.  Palpitations  This is a new problem. The current episode started more than 1 week ago. The problem occurs daily. The problem has been gradually worsening. The problem is associated with an unknown factor. Associated symptoms include chest pain, nausea, vomiting and shortness of breath. She has tried nothing for the symptoms. Risk factors include family history and obesity.  Pt reports her sister passed unexpectedly from MI one month ago.  States since then she has had palpitations.  States in the last week she has been progressively fatigued, noted sob and fullness in her throat.  Last night she began nausea and vomiting, denies known illness.  This morning she states increasingly weakness.  Reports hx of same seven years ago.  Denies hx of cardiac intervention.   Past Medical History  Diagnosis Date  . Asthma   . Hypertension     Past Surgical History  Procedure Date  . None     Family History  Problem Relation Age of Onset  . Heart attack Mother   . Heart attack Father   . Heart failure Sister   . Heart attack Sister     History  Substance Use Topics  . Smoking status: Never Smoker   . Smokeless tobacco: Not on file  . Alcohol Use: No    OB History    Grav Para Term Preterm Abortions TAB SAB Ect Mult Living                  Review of Systems  Constitutional: Positive for fatigue.  Respiratory: Positive for shortness of breath.   Cardiovascular: Positive for chest pain and palpitations.  Gastrointestinal: Positive for nausea and vomiting.  All other systems reviewed and are  negative.    Allergies  Penicillins  Home Medications   Current Outpatient Rx  Name  Route  Sig  Dispense  Refill  . HYDROCHLOROTHIAZIDE 25 MG PO TABS   Oral   Take 1 tablet (25 mg total) by mouth daily.   30 tablet   2   . ALBUTEROL SULFATE HFA 108 (90 BASE) MCG/ACT IN AERS   Inhalation   Inhale 1-2 puffs into the lungs every 6 (six) hours as needed for wheezing.   1 Inhaler   0   . AZITHROMYCIN 500 MG PO TABS   Oral   Take 1 tablet (500 mg total) by mouth daily.   5 tablet   0   . GUAIFENESIN-CODEINE 100-10 MG/5ML PO SYRP   Oral   Take 10 mLs by mouth 4 (four) times daily as needed for cough.   120 mL   0   . PREDNISONE (PAK) 10 MG PO TABS      Take 6 tablets on day 1, 5 tablets on day 2, 4 tablets on day 3, 3 tablets on day 4, 2 tablets on day 5, 1 tablet on day 6   21 tablet   0   . PREDNISONE 5 MG PO KIT   Oral   Take 1 kit (5 mg total) by mouth daily after breakfast. Prednisone 5 mg 6 day dosepack.  Take as directed.  1 kit   0     BP 136/98  Pulse 59  Temp 97.7 F (36.5 C) (Oral)  Resp 16  SpO2 100%  Physical Exam  Nursing note and vitals reviewed. Constitutional: She is oriented to person, place, and time. Vital signs are normal. She appears well-developed and well-nourished. She is active and cooperative.  HENT:  Head: Normocephalic.  Eyes: Conjunctivae normal are normal. Pupils are equal, round, and reactive to light. No scleral icterus.  Neck: Trachea normal and normal range of motion. Neck supple. Normal carotid pulses present. Carotid bruit is not present.  Cardiovascular: Normal rate, normal heart sounds, intact distal pulses and normal pulses.  An irregular rhythm present.  No murmur heard.      No pedal edema  Pulmonary/Chest: Effort normal and breath sounds normal.  Lymphadenopathy:    She has no cervical adenopathy.  Neurological: She is alert and oriented to person, place, and time. She has normal strength and normal reflexes.  No cranial nerve deficit or sensory deficit. Coordination and gait normal. GCS eye subscore is 4. GCS verbal subscore is 5. GCS motor subscore is 6.  Skin: Skin is warm and dry.  Psychiatric: She has a normal mood and affect. Her speech is normal and behavior is normal. Judgment and thought content normal. Cognition and memory are normal.    ED Course  Procedures (including critical care time)  Labs Reviewed - No data to display No results found.  EKG-Sinus rhythm with short PR with Premature atrial complexes with Abberant conduction Inferior-posterior infarct , age undetermined Abnormal ECG  1. Heart palpitations       MDM  Discussed with Dr. Shela Commons. Kindl.  Transfer to [facility] to rule out cardiac etiology of palpitations, sob and fatigue.  Extensive family cardiac hx.          Johnsie Kindred, NP 03/17/12 1248

## 2012-03-17 NOTE — ED Notes (Signed)
Pt is transfer from [facility] with complaints of palpitations, chest pressure, and sob over a month.

## 2012-03-17 NOTE — ED Notes (Signed)
Pt states that she does take HCTZ but on an as needed basis. Mw,cma

## 2012-03-17 NOTE — ED Notes (Signed)
1 minute rhythm strip per NP request

## 2012-03-17 NOTE — Discharge Instructions (Signed)
Premature Beats A premature beat is an extra heartbeat that happens earlier than normal. Premature beats are called premature atrial contractions (PACs) or premature ventricular contractions (PVCs) depending on the area of the heart where they start. CAUSES  Premature beats may be brought on by a variety of factors including:  Emotional stress.  Lack of sleep.  Caffeine.  Asthma medicines.  Stimulants.  Herbal teas.  Dietary supplements.  Alcohol. In most cases, premature beats are not dangerous and are not a sign of serious heart disease. Most patients evaluated for premature beats have completely normal heart function. Rarely, premature beats may be a sign of more significant heart problems or medical illness. SYMPTOMS  Premature beats may cause palpitations. This means you feel like your heart is skipping a beat or beating harder than usual. Sometimes, slight chest pain occurs with premature beats, lasting only a few seconds. This pain has been described as a "flopping" feeling inside the chest. In many cases, premature beats do not cause any symptoms and they are only detected when an electrocardiography test (EKG) or heart monitoring is performed. DIAGNOSIS  Your caregiver may run some tests to evaluate your heart such as an EKG or echocardiography. You may need to wear a portable heart monitor for several days to record the electrical activity of your heart. Blood testing may also be performed to check your electrolytes and thyroid function. TREATMENT  Premature beats usually go away with rest. If the problem continues, your caregiver will determine a treatment plan for you.  HOME CARE INSTRUCTIONS  Get plenty of rest over the next few days until your symptoms improve.  Avoid coffee, tea, alcohol, and soda (pop, cola).  Do not smoke. SEEK MEDICAL CARE IF:  Your symptoms continue after 1 to 2 days of rest.  You have new symptoms, such as chest pain or trouble  breathing. SEEK IMMEDIATE MEDICAL CARE IF:  You have severe chest pain or abdominal pain.  You have pain that radiates into the neck, arm, or jaw.  You faint or have extreme weakness.  You have shortness of breath.  Your heartbeat races for more than 5 seconds. MAKE SURE YOU:  Understand these instructions.  Will watch your condition.  Will get help right away if you are not doing well or get worse. Document Released: 04/11/2004 Document Revised: 05/27/2011 Document Reviewed: 11/05/2010 [employer] Patient Information 2013 [employer].  

## 2012-03-18 ENCOUNTER — Encounter (HOSPITAL_COMMUNITY): Payer: Self-pay | Admitting: Emergency Medicine

## 2012-03-19 NOTE — ED Provider Notes (Signed)
Medical screening examination/treatment/procedure(s) were performed by resident physician or non-physician practitioner and as supervising physician I was immediately available for consultation/collaboration.   Jencarlo Bonadonna DOUGLAS MD.    Jamee Pacholski D Maris Abascal, MD 03/19/12 2024 

## 2012-07-08 ENCOUNTER — Emergency Department (INDEPENDENT_AMBULATORY_CARE_PROVIDER_SITE_OTHER)
Admission: EM | Admit: 2012-07-08 | Discharge: 2012-07-08 | Disposition: A | Discharge status: Home / Self Care | Payer: Self-pay | Source: Home / Self Care | Attending: Family Medicine | Admitting: Family Medicine

## 2012-07-08 ENCOUNTER — Encounter (HOSPITAL_COMMUNITY): Payer: Self-pay | Admitting: Emergency Medicine

## 2012-07-08 DIAGNOSIS — IMO0002 Reserved for concepts with insufficient information to code with codable children: Secondary | ICD-10-CM

## 2012-07-08 DIAGNOSIS — S86912A Strain of unspecified muscle(s) and tendon(s) at lower leg level, left leg, initial encounter: Secondary | ICD-10-CM

## 2012-07-08 MED ORDER — HYDROCODONE-ACETAMINOPHEN 5-325 MG PO TABS: 1.0000 | ORAL_TABLET | ORAL | Status: DC | PRN

## 2012-07-08 NOTE — ED Notes (Signed)
Pt reports she fell and slipped on ice x 1.5 months ago. Left leg is painful and swelling. Hurts to stand or to put pressure on it. Has tried various pain medicines with no relief. Used ACE bandages to wrap it with no relief. Will see Ortho tomorrow. Patient is alert and in acute distress.

## 2012-07-08 NOTE — ED Provider Notes (Signed)
History     CSN: [id number]  Arrival date & time 07/08/12  1900   First MD Initiated Contact with Patient 07/08/12 1901      No chief complaint on file.   (Consider location/radiation/quality/duration/timing/severity/associated sxs/prior treatment) Patient is a 57 y.o. female presenting with leg pain. The history is provided by the patient and the spouse.  Leg Pain Location:  Knee Time since incident:  2 months Injury: yes   Mechanism of injury: fall   Mechanism of injury comment:  Slipped on ice at home, has gone on until can't take it anymore. Knee location:  L knee Pain details:    Quality:  Burning   Severity:  Moderate   Progression:  Worsening Chronicity:  New Dislocation: no   Relieved by:  Nothing Ineffective treatments:  Compression Associated symptoms: no back pain   Risk factors: obesity     Past Medical History  Diagnosis Date  . Asthma   . Hypertension     Past Surgical History  Procedure Laterality Date  . None      Family History  Problem Relation Age of Onset  . Heart attack Mother   . Heart attack Father   . Heart failure Sister   . Heart attack Sister     History  Substance Use Topics  . Smoking status: Never Smoker   . Smokeless tobacco: Not on file  . Alcohol Use: No    OB History   Grav Para Term Preterm Abortions TAB SAB Ect Mult Living                  Review of Systems  Constitutional: Negative.   Musculoskeletal: Positive for gait problem. Negative for myalgias, back pain, joint swelling and arthralgias.  Skin: Negative.     Allergies  Penicillins  Home Medications   Current Outpatient Rx  Name  Route  Sig  Dispense  Refill  . albuterol (PROVENTIL HFA;VENTOLIN HFA) 108 (90 BASE) MCG/ACT inhaler   Inhalation   Inhale 1-2 puffs into the lungs every 6 (six) hours as needed for wheezing.   1 Inhaler   0   . hydrochlorothiazide (HYDRODIURIL) 25 MG tablet   Oral   Take 1 tablet (25 mg total) by mouth daily.   30  tablet   2   . HYDROcodone-acetaminophen (NORCO/VICODIN) 5-325 MG per tablet   Oral   Take 1 tablet by mouth every 4 (four) hours as needed for pain.   10 tablet   0   . Multiple Vitamin (MULTIVITAMIN WITH MINERALS) TABS   Oral   Take 1 tablet by mouth daily.           BP 132/84  Pulse 65  Temp(Src) 98.1 F (36.7 C) (Oral)  Resp 16  SpO2 100%  Physical Exam  Nursing note and vitals reviewed. Constitutional: She is oriented to person, place, and time. She appears well-developed and well-nourished. She appears distressed.  Musculoskeletal: She exhibits tenderness.       Legs: Neurological: She is alert and oriented to person, place, and time.  Skin: Skin is warm and dry.    ED Course  Procedures (including critical care time)  Labs Reviewed - No data to display No results found.   1. Strain of left knee and leg, initial encounter       MDM          Linna Hoff, MD 07/08/12 2047

## 2012-08-06 ENCOUNTER — Ambulatory Visit (HOSPITAL_COMMUNITY)
Admission: RE | Admit: 2012-08-06 | Discharge: 2012-08-06 | Disposition: A | Discharge status: Home / Self Care | Payer: Self-pay | Source: Ambulatory Visit | Attending: Family Medicine | Admitting: Family Medicine

## 2012-08-06 ENCOUNTER — Other Ambulatory Visit (HOSPITAL_COMMUNITY): Payer: Self-pay | Admitting: Family Medicine

## 2012-08-06 DIAGNOSIS — M79609 Pain in unspecified limb: Secondary | ICD-10-CM

## 2012-08-06 DIAGNOSIS — M25562 Pain in left knee: Secondary | ICD-10-CM

## 2012-08-06 DIAGNOSIS — M7989 Other specified soft tissue disorders: Secondary | ICD-10-CM

## 2012-08-06 DIAGNOSIS — E669 Obesity, unspecified: Secondary | ICD-10-CM | POA: Insufficient documentation

## 2012-08-12 ENCOUNTER — Other Ambulatory Visit (HOSPITAL_COMMUNITY): Payer: Self-pay | Admitting: Family Medicine

## 2012-08-12 DIAGNOSIS — M25562 Pain in left knee: Secondary | ICD-10-CM

## 2012-08-14 ENCOUNTER — Ambulatory Visit (HOSPITAL_COMMUNITY): Admission: RE | Admit: 2012-08-14 | Payer: Self-pay | Source: Ambulatory Visit

## 2012-11-19 ENCOUNTER — Encounter (HOSPITAL_COMMUNITY): Payer: Self-pay | Admitting: Emergency Medicine

## 2012-11-19 ENCOUNTER — Emergency Department (INDEPENDENT_AMBULATORY_CARE_PROVIDER_SITE_OTHER)
Admission: EM | Admit: 2012-11-19 | Discharge: 2012-11-19 | Disposition: A | Discharge status: Home / Self Care | Payer: BC Managed Care – PPO | Source: Home / Self Care

## 2012-11-19 DIAGNOSIS — L272 Dermatitis due to ingested food: Secondary | ICD-10-CM

## 2012-11-19 DIAGNOSIS — T887XXA Unspecified adverse effect of drug or medicament, initial encounter: Secondary | ICD-10-CM

## 2012-11-19 DIAGNOSIS — T50905A Adverse effect of unspecified drugs, medicaments and biological substances, initial encounter: Secondary | ICD-10-CM

## 2012-11-19 LAB — POCT RAPID STREP A: Streptococcus, Group A Screen (Direct): NEGATIVE

## 2012-11-19 MED ORDER — HYDROCHLOROTHIAZIDE 25 MG PO TABS: 25.0000 mg | ORAL_TABLET | Freq: Every day | ORAL | Status: DC

## 2012-11-19 MED ORDER — HYDROCODONE-ACETAMINOPHEN 5-325 MG PO TABS
ORAL_TABLET | ORAL | Status: AC
  Filled 2012-11-19: qty 1

## 2012-11-19 MED ORDER — HYDROCODONE-ACETAMINOPHEN 5-325 MG PO TABS
1.0000 | ORAL_TABLET | Freq: Once | ORAL | Status: AC
  Administered 2012-11-19: 1 via ORAL

## 2012-11-19 MED ORDER — DIPHENHYDRAMINE HCL 50 MG/ML IJ SOLN
INTRAMUSCULAR | Status: AC
  Filled 2012-11-19: qty 1

## 2012-11-19 MED ORDER — DIPHENHYDRAMINE HCL 50 MG/ML IJ SOLN
50.0000 mg | Freq: Once | INTRAMUSCULAR | Status: AC
  Administered 2012-11-19: 50 mg via INTRAMUSCULAR

## 2012-11-19 NOTE — ED Provider Notes (Signed)
Medical screening examination/treatment/procedure(s) were performed by a resident physician or non-physician practitioner and as the supervising physician I was immediately available for consultation/collaboration.  Clementeen Graham, MD   Rodolph Bong, MD 11/19/12 [phone]

## 2012-11-19 NOTE — ED Notes (Signed)
Pt reports husband is on his way to p/u her up.

## 2012-11-19 NOTE — ED Notes (Signed)
Reports no hx of HTN but was seen here in 09/13 and dx w/HTN... Given HCTZ

## 2012-11-19 NOTE — ED Provider Notes (Signed)
CSN: [id number]     Arrival date & time 11/19/12  1056 History   First MD Initiated Contact with Patient 11/19/12 1150     Chief Complaint  Patient presents with  . Hypertension   (Consider location/radiation/quality/duration/timing/severity/associated sxs/prior Treatment) HPI Comments: 57 year old female presents with headache and tear of blood pressure. Proximal to 2 days ago she ate some salmon. For 18 hours later she developed swelling of the face and neck. Some hours following this she developed an itchy rash to the side of her face neck migrated to the right side of the face. She saw her PCP and treated with prednisone 10 mg dose pack. Today she took all the tablets which equaled proximally 70-80 mg. A few hours after taking that she again to feel ill. She developed a throbbing headache and photophobia. She had a pressure in her head and face. He took her blood pressure home and the systolic was 170-180 over diastolic over 100. Her blood pressure is elevated today. She does not have a history of hypertension. She does not take antihypertensives. Today's exam reveals no substantial swelling of the face or neck. I am unable to appreciate a rash. Her lungs are clear and there is no peripheral edema.    Past Medical History  Diagnosis Date  . Asthma   . Hypertension    Past Surgical History  Procedure Laterality Date  . None     Family History  Problem Relation Age of Onset  . Heart attack Mother   . Heart attack Father   . Heart failure Sister   . Heart attack Sister    History  Substance Use Topics  . Smoking status: Never Smoker   . Smokeless tobacco: Not on file  . Alcohol Use: No   OB History   Grav Para Term Preterm Abortions TAB SAB Ect Mult Living                 Review of Systems  Constitutional: Positive for activity change. Negative for fever.  HENT: Positive for congestion, sore throat, facial swelling and sinus pressure. Negative for hearing loss, ear pain,  mouth sores, trouble swallowing, neck stiffness, voice change, tinnitus and ear discharge.   Eyes: Positive for photophobia.  Respiratory: Positive for shortness of breath and wheezing.   Cardiovascular: Negative.   Gastrointestinal: Negative.   Genitourinary: Positive for frequency.  Skin: Positive for rash.  Psychiatric/Behavioral: The patient is nervous/anxious.     Allergies  Penicillins  Home Medications   Current Outpatient Rx  Name  Route  Sig  Dispense  Refill  . predniSONE (DELTASONE) 10 MG tablet   Oral   Take 10 mg by mouth daily.         Marland Kitchen albuterol (PROVENTIL HFA;VENTOLIN HFA) 108 (90 BASE) MCG/ACT inhaler   Inhalation   Inhale 1-2 puffs into the lungs every 6 (six) hours as needed for wheezing.   1 Inhaler   0   . hydrochlorothiazide (HYDRODIURIL) 25 MG tablet   Oral   Take 1 tablet (25 mg total) by mouth daily.   30 tablet   2   . hydrochlorothiazide (HYDRODIURIL) 25 MG tablet   Oral   Take 1 tablet (25 mg total) by mouth daily.   3 tablet   0   . HYDROcodone-acetaminophen (NORCO/VICODIN) 5-325 MG per tablet   Oral   Take 1 tablet by mouth every 4 (four) hours as needed for pain.   10 tablet   0   .  Multiple Vitamin (MULTIVITAMIN WITH MINERALS) TABS   Oral   Take 1 tablet by mouth daily.          BP 166/96  Pulse 68  Temp(Src) 98.7 F (37.1 C) (Oral)  Resp 20  SpO2 98% Physical Exam  Nursing note and vitals reviewed. Constitutional: She is oriented to person, place, and time. She appears well-developed and well-nourished. No distress.  HENT:  Right Ear: External ear normal.  Left Ear: External ear normal.  Mouth/Throat: No oropharyngeal exudate.  Positive for clear PND and cobblestoning to the oropharynx.  Eyes: Conjunctivae and EOM are normal. Pupils are equal, round, and reactive to light.  Neck: Normal range of motion. Neck supple. No tracheal deviation present. No thyromegaly present.  Cardiovascular: Normal rate, regular  rhythm, normal heart sounds and intact distal pulses.   Pulmonary/Chest: Effort normal and breath sounds normal. No respiratory distress. She has no wheezes. She has no rales.  Musculoskeletal: She exhibits no edema.  Lymphadenopathy:    She has no cervical adenopathy.  Neurological: She is alert and oriented to person, place, and time. She exhibits normal muscle tone.  Skin: Skin is warm and dry. No rash noted.  Psychiatric: She has a normal mood and affect.    ED Course  Procedures (including critical care time) Labs Review Labs Reviewed  POCT RAPID STREP A ([facility] CARE ONLY)   Imaging Review No results found.  MDM   1. Dermatitis due to allergic reaction to food   2. Adverse effects of medication, initial encounter    Benadryl 50 mg IM plus Norco 5 mg one by mouth prior to discharge. She will call her husband drive her home. Hydrochlorothiazide 25 mg by mouth daily and the following 2 days. She was instructed to start off with just a half a pill a day and she seemed to be worsening or feeling worse while taking this medication she should stop. I suspect her blood pressure will recover anyway. But this was a great concern for her. This may help alleviate some of the potential fluid overload and decreased blood pressure. Discontinue the prednisone Continue Benadryl 25-50 mg by mouth every 4-6 hours when necessary  Go home and go to sleep now. No driving today. I suspect her primary symptoms are due to the amount of prednisone she took yesterday. The symptoms began after taking approximately 80 mg yesterday. Her symptoms of swelling and rash have nearly abated.  her airway is widely patent. She is stable and ready for discharge.    Hayden Rasmussen, NP 11/19/12 1308

## 2012-11-19 NOTE — ED Notes (Signed)
Pt c/o HTN onset yest Sxs include: constant throbbing HA, nausea, blurry vision, facial pressure Denies: CP, SOB, dyspnea Reports she began taking prednisone for facial swelling due to an allergic reaction she had to salmon?? No PCP... Alert w/no signs of acute distress.

## 2012-11-19 NOTE — ED Notes (Signed)
Pt's husband came  °

## 2012-11-25 NOTE — ED Notes (Signed)
Group A culture throat not showing in orders.  I called [facility] on 9/8 and they said the culture was neg. Reviewed chart on 9/9 and 9/10 and it was not entered on pt.'s labs.  Olivia Rasmussen NP notified and he said to just document it. Olivia Gutierrez 11/25/2012

## 2013-01-11 ENCOUNTER — Other Ambulatory Visit: Payer: Self-pay | Admitting: Physician Assistant

## 2013-01-11 ENCOUNTER — Other Ambulatory Visit: Payer: Self-pay | Admitting: Family Medicine

## 2013-01-11 DIAGNOSIS — M25562 Pain in left knee: Secondary | ICD-10-CM

## 2013-02-26 ENCOUNTER — Other Ambulatory Visit (HOSPITAL_COMMUNITY): Payer: Self-pay | Admitting: Orthopaedic Surgery

## 2013-02-26 DIAGNOSIS — S83242A Other tear of medial meniscus, current injury, left knee, initial encounter: Secondary | ICD-10-CM

## 2013-03-06 ENCOUNTER — Emergency Department (HOSPITAL_COMMUNITY)
Admission: EM | Admit: 2013-03-06 | Discharge: 2013-03-06 | Disposition: A | Discharge status: Home / Self Care | Payer: BC Managed Care – PPO | Source: Home / Self Care | Attending: Family Medicine | Admitting: Family Medicine

## 2013-03-06 ENCOUNTER — Encounter (HOSPITAL_COMMUNITY): Payer: Self-pay | Admitting: Emergency Medicine

## 2013-03-06 DIAGNOSIS — J45901 Unspecified asthma with (acute) exacerbation: Secondary | ICD-10-CM

## 2013-03-06 DIAGNOSIS — J40 Bronchitis, not specified as acute or chronic: Secondary | ICD-10-CM

## 2013-03-06 MED ORDER — AZITHROMYCIN 250 MG PO TABS
250.0000 mg | ORAL_TABLET | Freq: Every day | ORAL | Status: DC
Start: 2013-03-06 — End: 2013-10-20

## 2013-03-06 MED ORDER — IPRATROPIUM BROMIDE 0.02 % IN SOLN
0.5000 mg | Freq: Once | RESPIRATORY_TRACT | Status: AC
  Administered 2013-03-06: 0.5 mg via RESPIRATORY_TRACT

## 2013-03-06 MED ORDER — IPRATROPIUM BROMIDE 0.02 % IN SOLN
RESPIRATORY_TRACT | Status: AC
  Filled 2013-03-06: qty 2.5

## 2013-03-06 MED ORDER — ALBUTEROL SULFATE (5 MG/ML) 0.5% IN NEBU
5.0000 mg | INHALATION_SOLUTION | Freq: Once | RESPIRATORY_TRACT | Status: AC
  Administered 2013-03-06: 5 mg via RESPIRATORY_TRACT

## 2013-03-06 MED ORDER — IPRATROPIUM BROMIDE 0.06 % NA SOLN: 2.0000 | Freq: Four times a day (QID) | NASAL | Status: DC

## 2013-03-06 MED ORDER — ALBUTEROL SULFATE HFA 108 (90 BASE) MCG/ACT IN AERS: 1.0000 | INHALATION_SPRAY | Freq: Four times a day (QID) | RESPIRATORY_TRACT | Status: DC | PRN

## 2013-03-06 MED ORDER — ALBUTEROL SULFATE (5 MG/ML) 0.5% IN NEBU
INHALATION_SOLUTION | RESPIRATORY_TRACT | Status: AC
  Filled 2013-03-06: qty 1

## 2013-03-06 MED ORDER — PREDNISONE 50 MG PO TABS: 50.0000 mg | ORAL_TABLET | Freq: Every day | ORAL | Status: DC

## 2013-03-06 MED ORDER — GUAIFENESIN-CODEINE 100-10 MG/5ML PO SOLN: 5.0000 mL | Freq: Every evening | ORAL | Status: DC | PRN

## 2013-03-06 MED ORDER — SODIUM CHLORIDE 0.9 % IN NEBU
INHALATION_SOLUTION | RESPIRATORY_TRACT | Status: AC
  Filled 2013-03-06: qty 3

## 2013-03-06 NOTE — ED Notes (Signed)
C/o cough x 1 week , no better w home treatment; audible wheezing

## 2013-03-06 NOTE — ED Provider Notes (Signed)
Olivia Gutierrez is a 57 y.o. female who presents to Urgent Care today for wheezing, mildly productive cough, nasal congestion and nasal discharge. The symptoms have been present for one week. Patient notes a wheeze he has been present for about one month.  She has been using her albuterol inhaler which do seem to help. She has not used it this morning. She notes shortness of breath but denies any nausea vomiting or diarrhea. She denies any significant chest pain. She is a medical history significant for asthma. She has never been hospitalized for her asthma symptoms.   Past Medical History  Diagnosis Date  . Asthma   . Hypertension    History  Substance Use Topics  . Smoking status: Never Smoker   . Smokeless tobacco: Not on file  . Alcohol Use: No   ROS as above Medications reviewed. No current facility-administered medications for this encounter.   Current Outpatient Prescriptions  Medication Sig Dispense Refill  . albuterol (PROVENTIL HFA;VENTOLIN HFA) 108 (90 BASE) MCG/ACT inhaler Inhale 1-2 puffs into the lungs every 6 (six) hours as needed for wheezing.  1 Inhaler  0  . azithromycin (ZITHROMAX) 250 MG tablet Take 1 tablet (250 mg total) by mouth daily. Take first 2 tablets together, then 1 every day until finished.  6 tablet  0  . guaiFENesin-codeine 100-10 MG/5ML syrup Take 5 mLs by mouth at bedtime as needed for cough.  120 mL  0  . hydrochlorothiazide (HYDRODIURIL) 25 MG tablet Take 1 tablet (25 mg total) by mouth daily.  30 tablet  2  . hydrochlorothiazide (HYDRODIURIL) 25 MG tablet Take 1 tablet (25 mg total) by mouth daily.  3 tablet  0  . HYDROcodone-acetaminophen (NORCO/VICODIN) 5-325 MG per tablet Take 1 tablet by mouth every 4 (four) hours as needed for pain.  10 tablet  0  . ipratropium (ATROVENT) 0.06 % nasal spray Place 2 sprays into both nostrils 4 (four) times daily.  15 mL  1  . Multiple Vitamin (MULTIVITAMIN WITH MINERALS) TABS Take 1 tablet by mouth daily.       . predniSONE (DELTASONE) 50 MG tablet Take 1 tablet (50 mg total) by mouth daily.  5 tablet  0    Exam:  BP 146/99  Pulse 65  Temp(Src) 98.3 F (36.8 C) (Oral)  Resp 26  SpO2 98% Gen: Well NAD, obese HEENT: EOMI,  MMM Lungs: Mild tachypnea and increased work of breathing. Respiratory phase is prolonged and wheezing is present bilaterally. Heart: RRR no MRG Abd: NABS, Soft. NT, ND Exts: Non edematous BL  LE, warm and well perfused.   Patient was given a DuoNeb nebulizer treatment, and had some moderate improvement in wheezing. She was given a second 5mg  albuterol neb treatment and felt much better.  Her lung exam improved.    Assessment and Plan: 57 y.o. female with Bronchitis and asthma exacerbation.  Plan to treat with prednisone, albuterol, Atrovent, azithromycin, codeine containing cough medication. Followup with primary care provider.  Discussed warning signs or symptoms. Please see discharge instructions. Patient expresses understanding.      Rodolph Bong, MD 03/06/13 [phone]

## 2013-03-09 ENCOUNTER — Ambulatory Visit (HOSPITAL_COMMUNITY)
Admission: RE | Admit: 2013-03-09 | Discharge: 2013-03-09 | Disposition: A | Discharge status: Home / Self Care | Payer: BC Managed Care – PPO | Source: Ambulatory Visit | Attending: Orthopaedic Surgery | Admitting: Orthopaedic Surgery

## 2013-03-09 DIAGNOSIS — IMO0002 Reserved for concepts with insufficient information to code with codable children: Secondary | ICD-10-CM | POA: Insufficient documentation

## 2013-03-09 DIAGNOSIS — S83242A Other tear of medial meniscus, current injury, left knee, initial encounter: Secondary | ICD-10-CM

## 2013-03-09 DIAGNOSIS — W19XXXA Unspecified fall, initial encounter: Secondary | ICD-10-CM | POA: Insufficient documentation

## 2013-03-09 DIAGNOSIS — M2240 Chondromalacia patellae, unspecified knee: Secondary | ICD-10-CM | POA: Insufficient documentation

## 2013-03-09 DIAGNOSIS — M1710 Unilateral primary osteoarthritis, unspecified knee: Secondary | ICD-10-CM | POA: Insufficient documentation

## 2013-03-09 DIAGNOSIS — M7120 Synovial cyst of popliteal space [Baker], unspecified knee: Secondary | ICD-10-CM | POA: Insufficient documentation

## 2013-08-11 ENCOUNTER — Encounter: Payer: Self-pay | Admitting: Internal Medicine

## 2013-10-12 ENCOUNTER — Ambulatory Visit: Payer: BC Managed Care – PPO | Admitting: Internal Medicine

## 2013-10-20 ENCOUNTER — Ambulatory Visit (INDEPENDENT_AMBULATORY_CARE_PROVIDER_SITE_OTHER): Payer: Self-pay | Admitting: Gastroenterology

## 2013-10-20 ENCOUNTER — Encounter: Payer: Self-pay | Admitting: Gastroenterology

## 2013-10-20 VITALS — BP 152/80 | HR 72 | Ht 66.25 in | Wt 272.4 lb

## 2013-10-20 DIAGNOSIS — K219 Gastro-esophageal reflux disease without esophagitis: Secondary | ICD-10-CM

## 2013-10-20 DIAGNOSIS — R1310 Dysphagia, unspecified: Secondary | ICD-10-CM

## 2013-10-20 DIAGNOSIS — Z1211 Encounter for screening for malignant neoplasm of colon: Secondary | ICD-10-CM | POA: Insufficient documentation

## 2013-10-20 MED ORDER — MOVIPREP 100 G PO SOLR: 1.0000 | Freq: Once | ORAL | Status: DC

## 2013-10-20 NOTE — Progress Notes (Signed)
10/20/2013 Olivia Gutierrez [id number] Apr 01, 1955   HISTORY OF PRESENT ILLNESS:  Patient is a 58 year old female who is new to our practice and was referred here by her PCP, Dr. Vista Lawman, to discuss colonoscopy as well as her issues with reflux.  She had a colonoscopy more than ten years ago, according to her report.  She sees occasional bright red blood on the toilet paper, but otherwise denies any bowel issues.  She does complain of severe reflux, however.  She says that the reflux issues have been on and off for many years, seemed to get better for a little while, but have been much worse recently.  She was previously on omeprazole, but that was not helping so she was switched to pantoprazole 40 mg daily.  She has been taking that now, but it has not been any help.  She says that she was taking a medication that her sister sent to her in the mail that she was getting from her physician; she says that it worked for her but she cannot remember what it was called.  She has the empty bottle at home so will call us with the name of the medication.  Says that she gets reflux at night and she can't bend over to do anything because the acid comes up into her mouth.  Complains of food getting hung up in her esophagus intermittently.  Has some upper abdominal pains, RUQ and now epigastric.  She says that there was a bulge in her epigastrium where the pain was and that it occurred after moving some furniture so thinks that it may have been a pulled muscle or a hernia.  Has a lot of loud, deep belching.  CBC, CMP, and TSH were normal in March.   Past Medical History  Diagnosis Date  . Asthma   . Hypertension   . Hyperlipidemia   . Morbid obesity   . Anemia   . Diverticulosis    Past Surgical History  Procedure Laterality Date  . None      reports that she has never smoked. She has never used smokeless tobacco. She reports that she does not drink alcohol or use illicit drugs. family history  includes Colon cancer in her brother; Cystic fibrosis in her sister; Heart attack in her mother and sister; Heart failure in her sister; Lung disease in her father; Stroke in her father. Allergies  Allergen Reactions  . Penicillins     Unknown      Outpatient Encounter Prescriptions as of 10/20/2013  Medication Sig  . Calcium Carbonate-Vitamin D (CALCIUM 600+D) 600-400 MG-UNIT per tablet Take 1 tablet by mouth daily.  Marland Kitchen ipratropium (ATROVENT HFA) 17 MCG/ACT inhaler Inhale 1 puff into the lungs every 6 (six) hours.  . Lactobacillus (ACIDOPHILUS PROBIOTIC) 100 MG CAPS Take 1 capsule by mouth daily.  . Multiple Vitamins-Iron (MULTIVITAMIN/IRON PO) Take 1 tablet by mouth daily.  . nabumetone (RELAFEN) 500 MG tablet Take 500 mg by mouth. 2-3 times per day as needed  . pantoprazole (PROTONIX) 40 MG tablet Take 40 mg by mouth daily.  . vitamin C (ASCORBIC ACID) 500 MG tablet Take 500 mg by mouth daily.  Marland Kitchen MOVIPREP 100 G SOLR Take 1 kit (200 g total) by mouth once.  . [DISCONTINUED] albuterol (PROVENTIL HFA;VENTOLIN HFA) 108 (90 BASE) MCG/ACT inhaler Inhale 1-2 puffs into the lungs every 6 (six) hours as needed for wheezing.  . [DISCONTINUED] azithromycin (ZITHROMAX) 250 MG tablet Take 1 tablet (250  mg total) by mouth daily. Take first 2 tablets together, then 1 every day until finished.  . [DISCONTINUED] guaiFENesin-codeine 100-10 MG/5ML syrup Take 5 mLs by mouth at bedtime as needed for cough.  . [DISCONTINUED] hydrochlorothiazide (HYDRODIURIL) 25 MG tablet Take 1 tablet (25 mg total) by mouth daily.  . [DISCONTINUED] hydrochlorothiazide (HYDRODIURIL) 25 MG tablet Take 1 tablet (25 mg total) by mouth daily.  . [DISCONTINUED] HYDROcodone-acetaminophen (NORCO/VICODIN) 5-325 MG per tablet Take 1 tablet by mouth every 4 (four) hours as needed for pain.  . [DISCONTINUED] ipratropium (ATROVENT) 0.06 % nasal spray Place 2 sprays into both nostrils 4 (four) times daily.  . [DISCONTINUED] Multiple Vitamin  (MULTIVITAMIN WITH MINERALS) TABS Take 1 tablet by mouth daily.  . [DISCONTINUED] predniSONE (DELTASONE) 50 MG tablet Take 1 tablet (50 mg total) by mouth daily.     REVIEW OF SYSTEMS  : All other systems reviewed and negative except where noted in the History of Present Illness.   PHYSICAL EXAM: BP 152/80  Pulse 72  Ht 5' 6.25" (1.683 m)  Wt 272 lb 6 oz (123.548 kg)  BMI 43.62 kg/m2 General: Well developed black female in no acute distress Head: Normocephalic and atraumatic Eyes:  Sclerae anicteric, conjunctiva pink. Ears: Normal auditory acuity  Lungs: Clear throughout to auscultation Heart: Regular rate and rhythm Abdomen: Soft, obese, non-distended.  Normal bowel sounds.  Mild epigastric TTP without R/R/G. Rectal:  Deferred.  Will be done at the time of colonoscopy.   Musculoskeletal: Symmetrical with no gross deformities  Skin: No lesions on visible extremities Extremities: No edema  Neurological: Alert oriented x 4, grossly non-focal Psychological:  Alert and cooperative. Normal mood and affect  ASSESSMENT AND PLAN: -Dysphagia and GERD:  Will schedule for EGD with possible dilation.  She will call back to give [place] the name of the medication that she was taking that was helping with her GERD (her sister was mailing her some of her prescription).  GERD dietary measures. -Screening colonoscopy:  Will schedule for colonoscopy.   The risks, benefits, and alternatives were discussed with the patient and she consents to proceed.

## 2013-10-20 NOTE — Patient Instructions (Signed)
You have been scheduled for an endoscopy and colonoscopy. Please follow the written instructions given to you at your visit today. Please pick up your prep at the pharmacy within the next 1-3 days. If you use inhalers (even only as needed), please bring them with you on the day of your procedure. Your physician has requested that you go to www.startemmi.com and enter the access code given to you at your visit today. This web site gives a general overview about your procedure. However, you should still follow specific instructions given to you by our office regarding your preparation for the procedure. CC:  Olivia Gutierrez   Food Choices for Gastroesophageal Reflux Disease When you have gastroesophageal reflux disease (GERD), the foods you eat and your eating habits are very important. Choosing the right foods can help ease your discomfort.  WHAT GUIDELINES DO I NEED TO FOLLOW?   Choose fruits, vegetables, whole grains, and low-fat dairy products.   Choose low-fat meat, fish, and poultry.  Limit fats such as oils, salad dressings, butter, nuts, and avocado.   Keep a food diary. This helps you identify foods that cause symptoms.   Avoid foods that cause symptoms. These may be different for everyone.   Eat small meals often instead of 3 large meals a day.   Eat your meals slowly, in a place where you are relaxed.   Limit fried foods.   Cook foods using methods other than frying.   Avoid drinking alcohol.   Avoid drinking large amounts of liquids with your meals.   Avoid bending over or lying down until 2-3 hours after eating.  WHAT FOODS ARE NOT RECOMMENDED?  These are some foods and drinks that may make your symptoms worse: Vegetables Tomatoes. Tomato juice. Tomato and spaghetti sauce. Chili peppers. Onion and garlic. Horseradish. Fruits Oranges, grapefruit, and lemon (fruit and juice). Meats High-fat meats, fish, and poultry. This includes hot dogs, ribs, ham,  sausage, salami, and bacon. Dairy Whole milk and chocolate milk. Sour cream. Cream. Butter. Ice cream. Cream cheese.  Drinks Coffee and tea. Bubbly (carbonated) drinks or energy drinks. Condiments Hot sauce. Barbecue sauce.  Sweets/Desserts Chocolate and cocoa. Donuts. Peppermint and spearmint. Fats and Oils High-fat foods. This includes [place] fries and potato chips. Other Vinegar. Strong spices. This includes black pepper, white pepper, red pepper, cayenne, curry powder, cloves, ginger, and chili powder. The items listed above may not be a complete list of foods and drinks to avoid. Contact your dietitian for more information. Document Released: 09/03/2011 Document Revised: 03/09/2013 Document Reviewed: 01/06/2013 [facility] Patient Information 2015 [place], [place]. This information is not intended to replace advice given to you by your health care provider. Make sure you discuss any questions you have with your health care provider.

## 2013-10-20 NOTE — Progress Notes (Signed)
Reviewed and agree with management plan.  Malcolm T. Stark, MD FACG 

## 2013-11-18 ENCOUNTER — Telehealth: Payer: Self-pay | Admitting: Gastroenterology

## 2013-11-18 NOTE — Telephone Encounter (Signed)
  I spoke to the patient, and she took her prep yesterday and she is due to have a colonoscopy on 9/4 last case.  She stated that she "still had half of prep left" and that she would "take it and some magnesium citrate."  She stated that 'It would be fine, and that she didn't eat anything except breakfast this am."  I told her that I would ask the doctor,and see what he wants to do, and call her back.

## 2013-11-18 NOTE — Telephone Encounter (Signed)
Dr. Fuller Plan was not available, so my boss told me to tell the patient to take her second half of the prep tomorrow am, and to call us with the results tomorrow am. We would decide what else to do then.

## 2013-11-19 ENCOUNTER — Encounter: Payer: Self-pay | Admitting: Gastroenterology

## 2013-11-19 ENCOUNTER — Ambulatory Visit (AMBULATORY_SURGERY_CENTER): Payer: Self-pay | Admitting: Gastroenterology

## 2013-11-19 VITALS — BP 142/88 | HR 59 | Temp 97.6°F | Resp 16 | Ht 66.25 in | Wt 272.0 lb

## 2013-11-19 DIAGNOSIS — K2100 Gastro-esophageal reflux disease with esophagitis, without bleeding: Secondary | ICD-10-CM

## 2013-11-19 DIAGNOSIS — R1319 Other dysphagia: Secondary | ICD-10-CM

## 2013-11-19 DIAGNOSIS — K222 Esophageal obstruction: Secondary | ICD-10-CM

## 2013-11-19 MED ORDER — PANTOPRAZOLE SODIUM 40 MG PO TBEC: 40.0000 mg | DELAYED_RELEASE_TABLET | Freq: Two times a day (BID) | ORAL | Status: DC

## 2013-11-19 MED ORDER — RANITIDINE HCL 300 MG PO TABS: 300.0000 mg | ORAL_TABLET | Freq: Every day | ORAL | Status: DC

## 2013-11-19 MED ORDER — SODIUM CHLORIDE 0.9 % IV SOLN: 500.0000 mL | INTRAVENOUS | Status: DC

## 2013-11-19 MED ORDER — FLEET ENEMA 7-19 GM/118ML RE ENEM
1.0000 | ENEMA | Freq: Once | RECTAL | Status: AC
  Administered 2013-11-19: 1 via RECTAL

## 2013-11-19 NOTE — Progress Notes (Signed)
Enema X1 given to patient.  Results are dark brown liquid.  Will proceed with EGD and reschedule colonoscopy.  Pt will need previsit appointment to review prep instructions.  Pt verbalizes understanding.

## 2013-11-19 NOTE — Patient Instructions (Signed)
YOU HAD AN ENDOSCOPIC PROCEDURE TODAY AT [facility]: Refer to the procedure report that was given to you for any specific questions about what was found during the examination.  If the procedure report does not answer your questions, please call your gastroenterologist to clarify.  If you requested that your care partner not be given the details of your procedure findings, then the procedure report has been included in a sealed envelope for you to review at your convenience later.  YOU SHOULD EXPECT: Some feelings of bloating in the abdomen. Passage of more gas than usual.  Walking can help get rid of the air that was put into your GI tract during the procedure and reduce the bloating. If you had a lower endoscopy (such as a colonoscopy or flexible sigmoidoscopy) you may notice spotting of blood in your stool or on the toilet paper. If you underwent a bowel prep for your procedure, then you may not have a normal bowel movement for a few days.  DIET:   Nothing by mouth until 4:45pm then you may have clear liquids.  If you tolerate those, you may proceed to a soft diet for the rest of today.  ACTIVITY: Your care partner should take you home directly after the procedure.  You should plan to take it easy, moving slowly for the rest of the day.  You can resume normal activity the day after the procedure however you should NOT DRIVE or use heavy machinery for 24 hours (because of the sedation medicines used during the test).    SYMPTOMS TO REPORT IMMEDIATELY: A gastroenterologist can be reached at any hour.  During normal business hours, 8:30 AM to 5:00 PM Monday through Friday, call [phone].  After hours and on weekends, please call the GI answering service at [phone] who will take a message and have the physician on call contact you.   Following upper endoscopy (EGD)  Vomiting of blood or coffee ground material  New chest pain or pain under the shoulder blades  Painful or  persistently difficult swallowing  New shortness of breath  Fever of 100F or higher  Black, tarry-looking stools  FOLLOW UP: If any biopsies were taken you will be contacted by phone or by letter within the next 1-3 weeks.  Call your gastroenterologist if you have not heard about the biopsies in 3 weeks.  Our staff will call the home number listed on your records the next business day following your procedure to check on you and address any questions or concerns that you may have at that time regarding the information given to you following your procedure. This is a courtesy call and so if there is no answer at the home number and we have not heard from you through the emergency physician on call, we will assume that you have returned to your regular daily activities without incident.  SIGNATURES/CONFIDENTIALITY: You and/or your care partner have signed paperwork which will be entered into your electronic medical record.  These signatures attest to the fact that that the information above on your After Visit Summary has been reviewed and is understood.  Full responsibility of the confidentiality of this discharge information lies with you and/or your care-partner.

## 2013-11-19 NOTE — Telephone Encounter (Signed)
Finish prep and then take Mag Citrate twice this morning should be ok. Please follow clear liquid diet instructions as written.

## 2013-11-19 NOTE — Op Note (Signed)
[facility]  [address] [place], [address]   ENDOSCOPY PROCEDURE REPORT  PATIENT: Olivia Gutierrez, Olivia Gutierrez  MR#: [id number] BIRTHDATE: 27-Oct-1955 , 77  yrs. old GENDER: Female ENDOSCOPIST: Ladene Artist, MD, [facility] REFERRED BY:  Benito Mccreedy, M.D. PROCEDURE DATE:  11/19/2013 PROCEDURE:  EGD, diagnostic and Savary dilation of esophagus ASA CLASS:     Class II INDICATIONS:  Dysphagia.   History of esophageal reflux. MEDICATIONS: MAC sedation, administered by CRNA, propofol (Diprivan) 250mg  IV, and Glycopyrrolate (Robinul) mg IV TOPICAL ANESTHETIC: none DESCRIPTION OF PROCEDURE: After the risks benefits and alternatives of the procedure were thoroughly explained, informed consent was obtained.  The LB [id number] endoscope was introduced through the mouth and advanced to the second portion of the duodenum. Without limitations.  The instrument was slowly withdrawn as the mucosa was fully examined.  ESOPHAGUS: There was LA Class D esophagitis noted.   A stricture was found at the gastroesophageal junction.  The stenosis was traversable with the endoscope. It was 12-13 mm in diameter.  The esophagus was otherwise normal. STOMACH: The mucosa and folds of the stomach appeared normal. DUODENUM: The duodenal mucosa showed no abnormalities in the bulb and second portion of the duodenum.  Retroflexed views revealed a 4 cm hiatal hernia.  A guidewire was placed in the antrum and the scope was then withdrawn from the patient. 14, 15 16 mm Savary dilators were passed with mild resistance to 15 and 16 mm dilators and no heme noted. The last dilator and guidewire were removed and the procedure completed.  COMPLICATIONS: There were no complications.  ENDOSCOPIC IMPRESSION: 1.   LA Class D esophagitis 2.   Stricture at the gastroesophageal junction; dilated 3.   4 cm hiatal hernia  RECOMMENDATIONS: 1.  Anti-reflux regimen with 4 " bedblocks long term 2.  PPI  bid long term: Protonix 40 mg po bid ac, 1 year of refills and ranititine 300 mg hs, 3 months supply 3.  OP follow-up in 6 weeks 4.  Post dilation instructions 5.  Colonoscopy with 2 day bowel prep when reflux under better control  eSigned:  Ladene Artist, MD, [facility] 11/19/2013 3:41 PM

## 2013-11-19 NOTE — Progress Notes (Signed)
Called to room to assist during endoscopic procedure.  Patient ID and intended procedure confirmed with present staff. Received instructions for my participation in the procedure from the performing physician.  

## 2013-11-19 NOTE — Progress Notes (Addendum)
Pt reports that her stools are still dark.  They are all liquid but dark brown.  Per Dr Fuller Plan we will give 1 enema.  If still dark will possibly reschedule colonoscopy.

## 2013-11-19 NOTE — Progress Notes (Signed)
Procedure ends, to recovery, report given and VSS. 

## 2013-11-23 ENCOUNTER — Telehealth: Payer: Self-pay | Admitting: *Deleted

## 2013-11-23 NOTE — Telephone Encounter (Signed)
  Follow up Call-  Call back number 11/19/2013  Post procedure Call Back phone  # [phone]  Permission to leave phone message Yes     Patient questions:  Do you have a fever, pain , or abdominal swelling? No. Pain Score  0 *  Have you tolerated food without any problems? Yes.    Have you been able to return to your normal activities? Yes.    Do you have any questions about your discharge instructions: Diet   No. Medications  No. Follow up visit  No.  Do you have questions or concerns about your Care? No.  Actions: * If pain score is 4 or above: No action needed, pain <4.

## 2013-11-25 ENCOUNTER — Telehealth: Payer: Self-pay

## 2013-11-25 ENCOUNTER — Telehealth: Payer: Self-pay | Admitting: Gastroenterology

## 2013-11-25 MED ORDER — PANTOPRAZOLE SODIUM 40 MG PO TBEC: 40.0000 mg | DELAYED_RELEASE_TABLET | Freq: Two times a day (BID) | ORAL | Status: DC

## 2013-11-25 MED ORDER — OMEPRAZOLE 40 MG PO CPDR: 40.0000 mg | DELAYED_RELEASE_CAPSULE | Freq: Two times a day (BID) | ORAL | Status: DC

## 2013-11-25 NOTE — Telephone Encounter (Signed)
Patient states her insurance will not cover her Protonix. Told patient we can try to switch her medication to something cheaper but we do not know what her insurance does cover. Told her that we can try to send in omeprazole 40 mg twice daily and to continue Zantac at bedtime. Patient states she could not afford the Zantac prescription but did pick up the Zantac 150 mg over the counter and just doubles up the medication to equal to 300 mg tablets. Told her that was fine and to call back if her medication is still not covered.

## 2013-11-25 NOTE — Telephone Encounter (Signed)
Patient states the omeprazole was actually more money after the pharmacy ran it and will just pick up the Protonix instead. Told patient I will change it on her medication list.

## 2013-12-29 ENCOUNTER — Encounter (HOSPITAL_COMMUNITY): Payer: Self-pay | Admitting: Emergency Medicine

## 2013-12-29 ENCOUNTER — Emergency Department (HOSPITAL_COMMUNITY): Payer: Self-pay

## 2013-12-29 ENCOUNTER — Emergency Department (HOSPITAL_COMMUNITY)
Admission: EM | Admit: 2013-12-29 | Discharge: 2013-12-29 | Disposition: A | Discharge status: Home / Self Care | Payer: Self-pay | Attending: Emergency Medicine | Admitting: Emergency Medicine

## 2013-12-29 DIAGNOSIS — R1013 Epigastric pain: Secondary | ICD-10-CM | POA: Insufficient documentation

## 2013-12-29 DIAGNOSIS — R112 Nausea with vomiting, unspecified: Secondary | ICD-10-CM | POA: Insufficient documentation

## 2013-12-29 DIAGNOSIS — Z862 Personal history of diseases of the blood and blood-forming organs and certain disorders involving the immune mechanism: Secondary | ICD-10-CM | POA: Insufficient documentation

## 2013-12-29 DIAGNOSIS — E785 Hyperlipidemia, unspecified: Secondary | ICD-10-CM | POA: Insufficient documentation

## 2013-12-29 DIAGNOSIS — K219 Gastro-esophageal reflux disease without esophagitis: Secondary | ICD-10-CM | POA: Insufficient documentation

## 2013-12-29 DIAGNOSIS — I10 Essential (primary) hypertension: Secondary | ICD-10-CM | POA: Insufficient documentation

## 2013-12-29 DIAGNOSIS — Z880 Allergy status to penicillin: Secondary | ICD-10-CM | POA: Insufficient documentation

## 2013-12-29 DIAGNOSIS — K5900 Constipation, unspecified: Secondary | ICD-10-CM | POA: Insufficient documentation

## 2013-12-29 DIAGNOSIS — R1032 Left lower quadrant pain: Secondary | ICD-10-CM | POA: Insufficient documentation

## 2013-12-29 DIAGNOSIS — Z79899 Other long term (current) drug therapy: Secondary | ICD-10-CM | POA: Insufficient documentation

## 2013-12-29 DIAGNOSIS — M1990 Unspecified osteoarthritis, unspecified site: Secondary | ICD-10-CM | POA: Insufficient documentation

## 2013-12-29 DIAGNOSIS — J45909 Unspecified asthma, uncomplicated: Secondary | ICD-10-CM | POA: Insufficient documentation

## 2013-12-29 LAB — CBC WITH DIFFERENTIAL/PLATELET
Basophils Absolute: 0 10*3/uL (ref 0.0–0.1)
Basophils Relative: 0 % (ref 0–1)
Eosinophils Absolute: 0.1 10*3/uL (ref 0.0–0.7)
Eosinophils Relative: 2 % (ref 0–5)
HCT: 33.8 % — ABNORMAL LOW (ref 36.0–46.0)
HEMOGLOBIN: 11.2 g/dL — AB (ref 12.0–15.0)
Lymphocytes Relative: 16 % (ref 12–46)
Lymphs Abs: 1.2 10*3/uL (ref 0.7–4.0)
MCH: 27.3 pg (ref 26.0–34.0)
MCHC: 33.1 g/dL (ref 30.0–36.0)
MCV: 82.4 fL (ref 78.0–100.0)
Monocytes Absolute: 0.4 10*3/uL (ref 0.1–1.0)
Monocytes Relative: 5 % (ref 3–12)
NEUTROS ABS: 6.1 10*3/uL (ref 1.7–7.7)
NEUTROS PCT: 77 % (ref 43–77)
Platelets: 268 10*3/uL (ref 150–400)
RBC: 4.1 MIL/uL (ref 3.87–5.11)
RDW: 13.7 % (ref 11.5–15.5)
WBC: 7.9 10*3/uL (ref 4.0–10.5)

## 2013-12-29 LAB — COMPREHENSIVE METABOLIC PANEL
ALBUMIN: 3.4 g/dL — AB (ref 3.5–5.2)
ALK PHOS: 81 U/L (ref 39–117)
ALT: 13 U/L (ref 0–35)
ANION GAP: 14 (ref 5–15)
AST: 17 U/L (ref 0–37)
BILIRUBIN TOTAL: 0.5 mg/dL (ref 0.3–1.2)
BUN: 17 mg/dL (ref 6–23)
CHLORIDE: 98 meq/L (ref 96–112)
CO2: 24 mEq/L (ref 19–32)
Calcium: 9.3 mg/dL (ref 8.4–10.5)
Creatinine, Ser: 0.83 mg/dL (ref 0.50–1.10)
GFR calc Af Amer: 89 mL/min — ABNORMAL LOW (ref >90)
GFR, EST NON AFRICAN AMERICAN: 77 mL/min — AB (ref >90)
Glucose, Bld: 118 mg/dL — ABNORMAL HIGH (ref 70–99)
POTASSIUM: 3.8 meq/L (ref 3.7–5.3)
Sodium: 136 mEq/L — ABNORMAL LOW (ref 137–147)
Total Protein: 7 g/dL (ref 6.0–8.3)

## 2013-12-29 LAB — LIPASE, BLOOD: LIPASE: 16 U/L (ref 11–59)

## 2013-12-29 MED ORDER — HYDROMORPHONE HCL 1 MG/ML IJ SOLN
0.5000 mg | Freq: Once | INTRAMUSCULAR | Status: AC
  Administered 2013-12-29: 0.5 mg via INTRAVENOUS
  Filled 2013-12-29: qty 1

## 2013-12-29 MED ORDER — DICYCLOMINE HCL 10 MG PO CAPS
20.0000 mg | ORAL_CAPSULE | Freq: Once | ORAL | Status: AC
  Administered 2013-12-29: 20 mg via ORAL
  Filled 2013-12-29: qty 2

## 2013-12-29 MED ORDER — ONDANSETRON HCL 4 MG PO TABS: 4.0000 mg | ORAL_TABLET | Freq: Four times a day (QID) | ORAL | Status: DC

## 2013-12-29 MED ORDER — DICYCLOMINE HCL 20 MG PO TABS: 20.0000 mg | ORAL_TABLET | Freq: Two times a day (BID) | ORAL | Status: DC

## 2013-12-29 MED ORDER — SODIUM CHLORIDE 0.9 % IV BOLUS (SEPSIS)
1000.0000 mL | Freq: Once | INTRAVENOUS | Status: AC
  Administered 2013-12-29: 1000 mL via INTRAVENOUS

## 2013-12-29 MED ORDER — MORPHINE SULFATE 4 MG/ML IJ SOLN
4.0000 mg | Freq: Once | INTRAMUSCULAR | Status: AC
  Administered 2013-12-29: 4 mg via INTRAVENOUS
  Filled 2013-12-29: qty 1

## 2013-12-29 MED ORDER — ONDANSETRON HCL 4 MG/2ML IJ SOLN
4.0000 mg | Freq: Once | INTRAMUSCULAR | Status: AC
  Administered 2013-12-29: 4 mg via INTRAVENOUS
  Filled 2013-12-29: qty 2

## 2013-12-29 MED ORDER — HYDROCODONE-ACETAMINOPHEN 5-325 MG PO TABS: 1.0000 | ORAL_TABLET | ORAL | Status: DC | PRN

## 2013-12-29 NOTE — ED Notes (Signed)
Pt c/o abd pain LLQ and vomiting that has been going on for two days. Pt states it a flare up of diverticulitis.  Pt denies any diarrhea.

## 2013-12-29 NOTE — ED Notes (Signed)
Pt. Unable to urinate at this time. Pt. Aware of giving urine specimen. Will collect when pt. Voids. Nurse was notified.

## 2013-12-29 NOTE — ED Notes (Signed)
Pt. unable to urinate at this time. Pt. Aware of giving a urine specimen. Will collect when pt. Voids.

## 2013-12-29 NOTE — ED Provider Notes (Signed)
CSN: [id number]     Arrival date & time 12/29/13  0706 History   First MD Initiated Contact with Patient 12/29/13 [id number]     Chief Complaint  Patient presents with  . Abdominal Pain  . Emesis     (Consider location/radiation/quality/duration/timing/severity/associated sxs/prior Treatment) Patient is a 58 y.o. female presenting with abdominal pain and vomiting. The history is provided by the patient. No language interpreter was used.  Abdominal Pain Pain location:  LLQ and LUQ Chronicity:  Recurrent Associated symptoms: constipation, nausea and vomiting   Associated symptoms: no chills and no fever   Associated symptoms comment:  Recurrent abdominal pain associated with nausea and vomiting for the past 2 days. She states these are recurrent symptoms that has been an ongoing problem for the past 15 years. No history of obstruction or previous abdominal surgeries. She denies hematemesis. No fever, dysuria. She reports constipation for the past 5 days despite use of her regular medications and dietary instructions from her GI doctor Fuller Plan).  Emesis Associated symptoms: abdominal pain   Associated symptoms: no chills     Past Medical History  Diagnosis Date  . Asthma   . Hypertension   . Hyperlipidemia   . Morbid obesity   . Anemia   . Diverticulosis   . Arthritis   . GERD (gastroesophageal reflux disease)    Past Surgical History  Procedure Laterality Date  . None     Family History  Problem Relation Age of Onset  . Heart attack Mother   . Stroke Father   . Heart failure Sister   . Heart attack Sister   . Lung disease Father   . Colon cancer Brother   . Cystic fibrosis Sister     x 4   History  Substance Use Topics  . Smoking status: Never Smoker   . Smokeless tobacco: Never Used  . Alcohol Use: No   OB History   Grav Para Term Preterm Abortions TAB SAB Ect Mult Living                 Review of Systems  Constitutional: Negative for fever and chills.  HENT:  Negative.   Respiratory: Negative.   Cardiovascular: Negative.   Gastrointestinal: Positive for nausea, vomiting, abdominal pain and constipation. Negative for abdominal distention.  Genitourinary: Negative.   Musculoskeletal: Negative.   Skin: Negative.   Neurological: Negative.       Allergies  Penicillins  Home Medications   Prior to Admission medications   Medication Sig Start Date End Date Taking? Authorizing Provider  Calcium Carbonate-Vitamin D (CALCIUM 600+D) 600-400 MG-UNIT per tablet Take 1 tablet by mouth daily.    Historical Provider, MD  ipratropium (ATROVENT HFA) 17 MCG/ACT inhaler Inhale 1 puff into the lungs every 6 (six) hours.    Historical Provider, MD  Lactobacillus (ACIDOPHILUS PROBIOTIC) 100 MG CAPS Take 1 capsule by mouth daily.    Historical Provider, MD  Multiple Vitamins-Iron (MULTIVITAMIN/IRON PO) Take 1 tablet by mouth daily.    Historical Provider, MD  nabumetone (RELAFEN) 500 MG tablet Take 500 mg by mouth. 2-3 times per day as needed    Historical Provider, MD  pantoprazole (PROTONIX) 40 MG tablet Take 1 tablet (40 mg total) by mouth 2 (two) times daily. 11/25/13   Ladene Artist, MD  ranitidine (ZANTAC) 300 MG tablet Take 1 tablet (300 mg total) by mouth at bedtime. 11/19/13   Ladene Artist, MD  vitamin C (ASCORBIC ACID) 500 MG tablet Take 500  mg by mouth daily.    Historical Provider, MD   BP 147/93  Pulse 71  Temp(Src) 97.9 F (36.6 C) (Oral)  Resp 24  SpO2 100% Physical Exam  Constitutional: She is oriented to person, place, and time. She appears well-developed and well-nourished.  HENT:  Head: Normocephalic.  Neck: Normal range of motion. Neck supple.  Cardiovascular: Normal rate and regular rhythm.   Pulmonary/Chest: Effort normal and breath sounds normal.  Abdominal: Soft. Bowel sounds are normal. There is tenderness. There is no rebound and no guarding.  Tender epigastric area without guarding. Soft abdomen.  Musculoskeletal: Normal  range of motion.  Neurological: She is alert and oriented to person, place, and time.  Skin: Skin is warm and dry. No rash noted.  Psychiatric: She has a normal mood and affect.    ED Course  Procedures (including critical care time) Labs Review Labs Reviewed  CBC WITH DIFFERENTIAL - Abnormal; Notable for the following:    Hemoglobin 11.2 (*)    HCT 33.8 (*)    All other components within normal limits  COMPREHENSIVE METABOLIC PANEL  LIPASE, BLOOD  URINALYSIS, ROUTINE W REFLEX MICROSCOPIC    Imaging Review No results found.   EKG Interpretation None      MDM   Final diagnoses:  None    1. Abdominal pain 2. N, V  NO leukocytosis, no imaging abnormalities.No further vomiting in ED. Afebrile with stable vital signs. She has had recurrent pain for many years with pain on the left today which is the only change. She has GI follow up in the community. She is felt stable for discharge home to follow up with Dr. Fuller Plan.     Dewaine Oats, PA-C 12/29/13 1839

## 2013-12-29 NOTE — Discharge Instructions (Signed)

## 2013-12-29 NOTE — ED Provider Notes (Signed)
Medical screening examination/treatment/procedure(s) were performed by non-physician practitioner and as supervising physician I was immediately available for consultation/collaboration.   EKG Interpretation None        Delice Bison Ward, DO 12/29/13 2126

## 2014-04-19 ENCOUNTER — Other Ambulatory Visit: Payer: Self-pay | Admitting: Physician Assistant

## 2014-04-19 DIAGNOSIS — Z1231 Encounter for screening mammogram for malignant neoplasm of breast: Secondary | ICD-10-CM

## 2014-06-01 ENCOUNTER — Ambulatory Visit: Payer: Self-pay | Admitting: Cardiovascular Disease

## 2014-09-28 ENCOUNTER — Observation Stay (HOSPITAL_COMMUNITY)
Admission: EM | Admit: 2014-09-28 | Discharge: 2014-09-29 | Disposition: A | Discharge status: Home / Self Care | Payer: 59 | Attending: Family Medicine | Admitting: Family Medicine

## 2014-09-28 ENCOUNTER — Encounter (HOSPITAL_COMMUNITY): Payer: Self-pay | Admitting: *Deleted

## 2014-09-28 DIAGNOSIS — D649 Anemia, unspecified: Secondary | ICD-10-CM | POA: Diagnosis not present

## 2014-09-28 DIAGNOSIS — E785 Hyperlipidemia, unspecified: Secondary | ICD-10-CM | POA: Diagnosis not present

## 2014-09-28 DIAGNOSIS — E662 Morbid (severe) obesity with alveolar hypoventilation: Principal | ICD-10-CM | POA: Insufficient documentation

## 2014-09-28 DIAGNOSIS — E876 Hypokalemia: Secondary | ICD-10-CM | POA: Insufficient documentation

## 2014-09-28 DIAGNOSIS — Z91010 Allergy to peanuts: Secondary | ICD-10-CM | POA: Diagnosis not present

## 2014-09-28 DIAGNOSIS — K219 Gastro-esophageal reflux disease without esophagitis: Secondary | ICD-10-CM | POA: Insufficient documentation

## 2014-09-28 DIAGNOSIS — Z91018 Allergy to other foods: Secondary | ICD-10-CM | POA: Insufficient documentation

## 2014-09-28 DIAGNOSIS — J45909 Unspecified asthma, uncomplicated: Secondary | ICD-10-CM | POA: Insufficient documentation

## 2014-09-28 DIAGNOSIS — R0602 Shortness of breath: Secondary | ICD-10-CM | POA: Diagnosis present

## 2014-09-28 DIAGNOSIS — Z6841 Body Mass Index (BMI) 40.0 and over, adult: Secondary | ICD-10-CM | POA: Insufficient documentation

## 2014-09-28 DIAGNOSIS — R9431 Abnormal electrocardiogram [ECG] [EKG]: Secondary | ICD-10-CM | POA: Insufficient documentation

## 2014-09-28 DIAGNOSIS — I10 Essential (primary) hypertension: Secondary | ICD-10-CM | POA: Diagnosis not present

## 2014-09-28 DIAGNOSIS — Z880 Allergy status to penicillin: Secondary | ICD-10-CM | POA: Diagnosis not present

## 2014-09-28 DIAGNOSIS — M1990 Unspecified osteoarthritis, unspecified site: Secondary | ICD-10-CM | POA: Insufficient documentation

## 2014-09-28 NOTE — ED Notes (Signed)
Pt [place] she became SOB with pins and needles sensation all over her body.  Pt also reports feeling dizzy.  Pt is hyperventilating in triage.  Requested O2.  Pt's sats 100% RA.

## 2014-09-29 ENCOUNTER — Encounter (HOSPITAL_COMMUNITY): Payer: Self-pay | Admitting: Radiology

## 2014-09-29 ENCOUNTER — Emergency Department (HOSPITAL_COMMUNITY): Payer: 59

## 2014-09-29 DIAGNOSIS — D649 Anemia, unspecified: Secondary | ICD-10-CM | POA: Diagnosis not present

## 2014-09-29 DIAGNOSIS — I10 Essential (primary) hypertension: Secondary | ICD-10-CM | POA: Diagnosis not present

## 2014-09-29 DIAGNOSIS — R0602 Shortness of breath: Secondary | ICD-10-CM | POA: Diagnosis present

## 2014-09-29 LAB — COMPREHENSIVE METABOLIC PANEL
ALT: 13 U/L — ABNORMAL LOW (ref 14–54)
AST: 14 U/L — ABNORMAL LOW (ref 15–41)
Albumin: 3.2 g/dL — ABNORMAL LOW (ref 3.5–5.0)
Alkaline Phosphatase: 61 U/L (ref 38–126)
Anion gap: 5 (ref 5–15)
BUN: 13 mg/dL (ref 6–20)
CO2: 26 mmol/L (ref 22–32)
Calcium: 8.6 mg/dL — ABNORMAL LOW (ref 8.9–10.3)
Chloride: 106 mmol/L (ref 101–111)
Creatinine, Ser: 0.81 mg/dL (ref 0.44–1.00)
GFR calc Af Amer: >60 mL/min (ref >60)
GFR calc non Af Amer: >60 mL/min (ref >60)
GLUCOSE: 94 mg/dL (ref 65–99)
Potassium: 3.9 mmol/L (ref 3.5–5.1)
SODIUM: 137 mmol/L (ref 135–145)
Total Bilirubin: 0.3 mg/dL (ref 0.3–1.2)
Total Protein: 5.9 g/dL — ABNORMAL LOW (ref 6.5–8.1)

## 2014-09-29 LAB — CBC WITH DIFFERENTIAL/PLATELET
Basophils Absolute: 0 10*3/uL (ref 0.0–0.1)
Basophils Absolute: 0 10*3/uL (ref 0.0–0.1)
Basophils Relative: 0 % (ref 0–1)
Basophils Relative: 0 % (ref 0–1)
EOS ABS: 0.3 10*3/uL (ref 0.0–0.7)
EOS PCT: 4 % (ref 0–5)
EOS PCT: 5 % (ref 0–5)
Eosinophils Absolute: 0.2 10*3/uL (ref 0.0–0.7)
HCT: 33.4 % — ABNORMAL LOW (ref 36.0–46.0)
HEMATOCRIT: 32.7 % — AB (ref 36.0–46.0)
Hemoglobin: 10.8 g/dL — ABNORMAL LOW (ref 12.0–15.0)
Hemoglobin: 10.6 g/dL — ABNORMAL LOW (ref 12.0–15.0)
LYMPHS ABS: 2.2 10*3/uL (ref 0.7–4.0)
Lymphocytes Relative: 33 % (ref 12–46)
Lymphocytes Relative: 32 % (ref 12–46)
Lymphs Abs: 1.8 10*3/uL (ref 0.7–4.0)
MCH: 27.4 pg (ref 26.0–34.0)
MCH: 28 pg (ref 26.0–34.0)
MCHC: 32.3 g/dL (ref 30.0–36.0)
MCHC: 32.4 g/dL (ref 30.0–36.0)
MCV: 84.8 fL (ref 78.0–100.0)
MCV: 86.3 fL (ref 78.0–100.0)
MONO ABS: 0.5 10*3/uL (ref 0.1–1.0)
MONOS PCT: 8 % (ref 3–12)
Monocytes Absolute: 0.5 10*3/uL (ref 0.1–1.0)
Monocytes Relative: 8 % (ref 3–12)
NEUTROS ABS: 3.1 10*3/uL (ref 1.7–7.7)
NEUTROS ABS: 3.7 10*3/uL (ref 1.7–7.7)
NEUTROS PCT: 54 % (ref 43–77)
NEUTROS PCT: 56 % (ref 43–77)
Platelets: 264 10*3/uL (ref 150–400)
Platelets: 238 10*3/uL (ref 150–400)
RBC: 3.79 MIL/uL — AB (ref 3.87–5.11)
RBC: 3.94 MIL/uL (ref 3.87–5.11)
RDW: 13.9 % (ref 11.5–15.5)
RDW: 14.3 % (ref 11.5–15.5)
WBC: 6.8 10*3/uL (ref 4.0–10.5)
WBC: 5.6 10*3/uL (ref 4.0–10.5)

## 2014-09-29 LAB — TROPONIN I: Troponin I: <0.03 ng/mL (ref <0.031)

## 2014-09-29 LAB — I-STAT CHEM 8, ED
BUN: 15 mg/dL (ref 6–20)
Calcium, Ion: 1.11 mmol/L — ABNORMAL LOW (ref 1.12–1.23)
Chloride: 102 mmol/L (ref 101–111)
Creatinine, Ser: 0.9 mg/dL (ref 0.44–1.00)
GLUCOSE: 98 mg/dL (ref 65–99)
HCT: 33 % — ABNORMAL LOW (ref 36.0–46.0)
Hemoglobin: 11.2 g/dL — ABNORMAL LOW (ref 12.0–15.0)
Potassium: 3.4 mmol/L — ABNORMAL LOW (ref 3.5–5.1)
Sodium: 133 mmol/L — ABNORMAL LOW (ref 135–145)
TCO2: 26 mmol/L (ref 0–100)

## 2014-09-29 LAB — D-DIMER, QUANTITATIVE (NOT AT ARMC): D-Dimer, Quant: <0.27 ug/mL-FEU (ref 0.00–0.48)

## 2014-09-29 LAB — CBG MONITORING, ED: Glucose-Capillary: 104 mg/dL — ABNORMAL HIGH (ref 65–99)

## 2014-09-29 LAB — I-STAT TROPONIN, ED: Troponin i, poc: 0 ng/mL (ref 0.00–0.08)

## 2014-09-29 MED ORDER — IRBESARTAN 150 MG PO TABS
150.0000 mg | ORAL_TABLET | Freq: Every day | ORAL | Status: DC
  Administered 2014-09-29: 150 mg via ORAL
  Filled 2014-09-29: qty 1

## 2014-09-29 MED ORDER — ASPIRIN EC 325 MG PO TBEC
325.0000 mg | DELAYED_RELEASE_TABLET | Freq: Every day | ORAL | Status: DC
  Administered 2014-09-29: 325 mg via ORAL
  Filled 2014-09-29: qty 1

## 2014-09-29 MED ORDER — PANTOPRAZOLE SODIUM 40 MG PO TBEC
40.0000 mg | DELAYED_RELEASE_TABLET | Freq: Two times a day (BID) | ORAL | Status: DC
  Administered 2014-09-29: 40 mg via ORAL
  Filled 2014-09-29: qty 1

## 2014-09-29 MED ORDER — ACETAMINOPHEN 325 MG PO TABS: 650.0000 mg | ORAL_TABLET | Freq: Four times a day (QID) | ORAL | Status: DC | PRN

## 2014-09-29 MED ORDER — ONDANSETRON HCL 4 MG PO TABS: 4.0000 mg | ORAL_TABLET | Freq: Four times a day (QID) | ORAL | Status: DC | PRN

## 2014-09-29 MED ORDER — HYDROCHLOROTHIAZIDE 25 MG PO TABS
25.0000 mg | ORAL_TABLET | Freq: Every day | ORAL | Status: DC
  Administered 2014-09-29: 25 mg via ORAL
  Filled 2014-09-29: qty 1

## 2014-09-29 MED ORDER — VALSARTAN-HYDROCHLOROTHIAZIDE 160-25 MG PO TABS: 1.0000 | ORAL_TABLET | Freq: Every morning | ORAL | Status: DC

## 2014-09-29 MED ORDER — DIPHENHYDRAMINE HCL 25 MG PO CAPS
50.0000 mg | ORAL_CAPSULE | Freq: Once | ORAL | Status: AC
  Administered 2014-09-29: 50 mg via ORAL
  Filled 2014-09-29: qty 2

## 2014-09-29 MED ORDER — ACETAMINOPHEN 650 MG RE SUPP: 650.0000 mg | Freq: Four times a day (QID) | RECTAL | Status: DC | PRN

## 2014-09-29 MED ORDER — SODIUM CHLORIDE 0.9 % IJ SOLN
3.0000 mL | Freq: Two times a day (BID) | INTRAMUSCULAR | Status: DC
  Administered 2014-09-29: 3 mL via INTRAVENOUS

## 2014-09-29 MED ORDER — ENOXAPARIN SODIUM 40 MG/0.4ML ~~LOC~~ SOLN: 40.0000 mg | SUBCUTANEOUS | Status: DC

## 2014-09-29 MED ORDER — ONDANSETRON HCL 4 MG/2ML IJ SOLN: 4.0000 mg | Freq: Four times a day (QID) | INTRAMUSCULAR | Status: DC | PRN

## 2014-09-29 MED ORDER — SODIUM CHLORIDE 0.9 % IV BOLUS (SEPSIS)
1000.0000 mL | Freq: Once | INTRAVENOUS | Status: AC
  Administered 2014-09-29: 1000 mL via INTRAVENOUS

## 2014-09-29 MED ORDER — ENOXAPARIN SODIUM 60 MG/0.6ML ~~LOC~~ SOLN
60.0000 mg | Freq: Every day | SUBCUTANEOUS | Status: DC
  Administered 2014-09-29: 60 mg via SUBCUTANEOUS
  Filled 2014-09-29: qty 0.6

## 2014-09-29 MED ORDER — IOHEXOL 350 MG/ML SOLN
80.0000 mL | Freq: Once | INTRAVENOUS | Status: AC | PRN
  Administered 2014-09-29: 80 mL via INTRAVENOUS

## 2014-09-29 MED ORDER — ALBUTEROL SULFATE (2.5 MG/3ML) 0.083% IN NEBU: 3.0000 mL | INHALATION_SOLUTION | Freq: Four times a day (QID) | RESPIRATORY_TRACT | Status: DC | PRN

## 2014-09-29 MED ORDER — POTASSIUM CHLORIDE CRYS ER 20 MEQ PO TBCR
20.0000 meq | EXTENDED_RELEASE_TABLET | Freq: Once | ORAL | Status: AC
  Administered 2014-09-29: 20 meq via ORAL
  Filled 2014-09-29: qty 1

## 2014-09-29 NOTE — Discharge Instructions (Signed)
Shortness of Breath Olivia Gutierrez, your workup in the emergency department was normal. Her shortness of breath may be related to heat exhaustion and dehydration. Patient to drink plenty of water daily. See your primary care physician within 3 days for close follow-up. If any symptoms worsen come back to emergency department immediately. Thank you. Shortness of breath means you have trouble breathing. Shortness of breath needs medical care right away. HOME CARE   Do not smoke.  Avoid being around chemicals or things (paint fumes, dust) that may bother your breathing.  Rest as needed. Slowly begin your normal activities.  Only take medicines as told by your doctor.  Keep all doctor visits as told. GET HELP RIGHT AWAY IF:   Your shortness of breath gets worse.  You feel lightheaded, pass out (faint), or have a cough that is not helped by medicine.  You cough up blood.  You have pain with breathing.  You have pain in your chest, arms, shoulders, or belly (abdomen).  You have a fever.  You cannot walk up stairs or exercise the way you normally do.  You do not get better in the time expected.  You have a hard time doing normal activities even with rest.  You have problems with your medicines.  You have any new symptoms. MAKE SURE YOU:  Understand these instructions.  Will watch your condition.  Will get help right away if you are not doing well or get worse. Document Released: 08/21/2007 Document Revised: 03/09/2013 Document Reviewed: 05/20/2011 [facility] Patient Information 2015 [place], [place]. This information is not intended to replace advice given to you by your health care provider. Make sure you discuss any questions you have with your health care provider.

## 2014-09-29 NOTE — H&P (Signed)
[facility] History and Physical  Olivia Gutierrez JGG:[id number] DOB: Mar 01, 1956 DOA: 09/28/2014  Referring physician: Dr.Oni. PCP: Benito Mccreedy, MD  Specialists: Dr.Ganji.  Chief Complaint: Shortness of breath.  HPI: Olivia Gutierrez is a 59 y.o. female with history of hypertension and chronic anemia presents to the ER because of shortness of breath. Patient states she was in the church last evening when suddenly patient started developing shortness of breath with flushed sensation. Patient's symptoms persisted until patient HDL and was given oxygen. Denies any chest pain and productive cough fever chills. In the ER CT angiogram of the chest was unremarkable. EKG was showing flipped T waves. Patient's cardiologist Dr. Einar Gip was consulted by the ER physician and at this time since patient's EKG shows flipped T waves patient has been admitted to rule out ACS. Patient has had a stress test done 2 weeks ago which as per the patient was unremarkable.   Review of Systems: As presented in the history of presenting illness, rest negative.  Past Medical History  Diagnosis Date  . Asthma   . Hypertension   . Hyperlipidemia   . Morbid obesity   . Anemia   . Diverticulosis   . Arthritis   . GERD (gastroesophageal reflux disease)    Past Surgical History  Procedure Laterality Date  . None     Social History:  reports that she has never smoked. She has never used smokeless tobacco. She reports that she does not drink alcohol or use illicit drugs. Where does patient live home. Can patient participate in ADLs? Yes.  Allergies  Allergen Reactions  . Other Shortness Of Breath and Itching    Certain peanuts.. Pecans, walnuts  . Penicillins     Unknown    Family History:  Family History  Problem Relation Age of Onset  . Heart attack Mother   . Stroke Father   . Heart failure Sister   . Heart attack Sister   . Lung disease Father   . Colon cancer Brother   .  Cystic fibrosis Sister     x 4      Prior to Admission medications   Medication Sig Start Date End Date Taking? Authorizing Provider  albuterol (PROVENTIL HFA;VENTOLIN HFA) 108 (90 BASE) MCG/ACT inhaler Inhale 1-2 puffs into the lungs every 6 (six) hours as needed for wheezing or shortness of breath.   Yes Historical Provider, MD  ibuprofen (ADVIL,MOTRIN) 200 MG tablet Take 400 mg by mouth every 6 (six) hours as needed for mild pain or moderate pain.   Yes Historical Provider, MD  pantoprazole (PROTONIX) 40 MG tablet Take 1 tablet (40 mg total) by mouth 2 (two) times daily. 11/25/13  Yes Ladene Artist, MD  valsartan-hydrochlorothiazide (DIOVAN-HCT) 160-25 MG per tablet Take 1 tablet by mouth every morning. 08/17/14  Yes Historical Provider, MD  dicyclomine (BENTYL) 20 MG tablet Take 1 tablet (20 mg total) by mouth 2 (two) times daily. Patient not taking: Reported on 09/29/2014 12/29/13   Charlann Lange, PA-C  HYDROcodone-acetaminophen (NORCO/VICODIN) 5-325 MG per tablet Take 1-2 tablets by mouth every 4 (four) hours as needed. Patient not taking: Reported on 09/29/2014 12/29/13   Charlann Lange, PA-C  ondansetron (ZOFRAN) 4 MG tablet Take 1 tablet (4 mg total) by mouth every 6 (six) hours. Patient not taking: Reported on 09/29/2014 12/29/13   Charlann Lange, PA-C  ranitidine (ZANTAC) 300 MG tablet Take 1 tablet (300 mg total) by mouth at bedtime. Patient not taking: Reported on 09/29/2014 11/19/13   Norberto Sorenson  Olivia Guadeloupe, MD    Physical Exam: Filed Vitals:   09/28/14 2228 09/28/14 2231 09/29/14 0239 09/29/14 0534  BP: 115/70  137/90 118/67  Pulse: 65  65 67  Temp:  98 F (36.7 C)  97.6 F (36.4 C)  TempSrc:  Oral  Oral  Resp: 21  12 14   SpO2: 100%  100% 99%     General:  Obese not in distress.  Eyes: Anicteric no pallor.  ENT: No discharge from the ears eyes nose and mouth.  Neck: No mass felt.  Cardiovascular: S1-S2 heard.  Respiratory: No rhonchi or crepitations.  Abdomen: Soft  nontender bowel sounds present.  Skin: No rash.  Musculoskeletal: No edema.  Psychiatric: Appears normal.  Neurologic: Alert awake oriented to time place and person. Moves all extremities.  Labs on Admission:  Basic Metabolic Panel:  Recent Labs Lab 09/29/14 0040  NA 133*  K 3.4*  CL 102  GLUCOSE 98  BUN 15  CREATININE 0.90   Liver Function Tests: No results for input(s): AST, ALT, ALKPHOS, BILITOT, PROT, ALBUMIN in the last 168 hours. No results for input(s): LIPASE, AMYLASE in the last 168 hours. No results for input(s): AMMONIA in the last 168 hours. CBC:  Recent Labs Lab 09/29/14 0027 09/29/14 0040  WBC 6.8  --   NEUTROABS 3.7  --   HGB 10.8* 11.2*  HCT 33.4* 33.0*  MCV 84.8  --   PLT 264  --    Cardiac Enzymes: No results for input(s): CKTOTAL, CKMB, CKMBINDEX, TROPONINI in the last 168 hours.  BNP (last 3 results) No results for input(s): BNP in the last 8760 hours.  ProBNP (last 3 results) No results for input(s): PROBNP in the last 8760 hours.  CBG:  Recent Labs Lab 09/29/14 0033  GLUCAP 104*    Radiological Exams on Admission: Dg Chest 2 View  09/29/2014   CLINICAL DATA:  59 year old female with shortness of breath  EXAM: CHEST  2 VIEW  COMPARISON:  Radiograph dated 12/29/2013  FINDINGS: The heart size and mediastinal contours are within normal limits. Both lungs are clear. The visualized skeletal structures are unremarkable.  IMPRESSION: No active cardiopulmonary disease.   Electronically Signed   By: Anner Crete M.D.   On: 09/29/2014 01:18   Ct Angio Chest Pe W/cm &/or Wo Cm  09/29/2014   CLINICAL DATA:  Shortness of breath with pins and needle sensation all over the body.  EXAM: CT ANGIOGRAPHY CHEST WITH CONTRAST  TECHNIQUE: Multidetector CT imaging of the chest was performed using the standard protocol during bolus administration of intravenous contrast. Multiplanar CT image reconstructions and MIPs were obtained to evaluate the vascular  anatomy.  CONTRAST:  61mL OMNIPAQUE IOHEXOL 350 MG/ML SOLN  COMPARISON:  None.  FINDINGS: Technically adequate study with good opacification of the central and segmental pulmonary arteries. No focal filling defects. No evidence of significant pulmonary embolus.  Mild cardiac enlargement. Normal caliber thoracic aorta. Great vessel origins are patent. Esophagus is decompressed. Small esophageal hiatal hernia. No significant lymphadenopathy in the chest.  Evaluation of lungs is limited due to respiratory motion artifact. There is no gross consolidation or airspace disease. Airways are patent. No pleural effusion. No pneumothorax.  Included portions of the upper abdominal organs are grossly unremarkable. Degenerative changes in the spine. No destructive bone lesions.  Review of the MIP images confirms the above findings.  IMPRESSION: No evidence of significant pulmonary embolus. No evidence of active pulmonary disease.   Electronically Signed   By:  Lucienne Capers M.D.   On: 09/29/2014 03:40    EKG: Independently reviewed. Normal sinus rhythm with inferolateral T-wave inversions.  Assessment/Plan Principal Problem:   SOB (shortness of breath) Active Problems:   Essential hypertension   Chronic anemia   1. Shortness of breath with EKG changes - patient's cardiologist Dr. Einar Gip was notified and will be seeing patient in consult. Patient will be given. In anticipation of possible procedures. Aspirin. 2. Mild hypokalemia - replace and recheck. 3. Hypertension - continue present medications. 4. Anemia - follow CBC.   DVT Prophylaxis Lovenox.  Code Status: Full code.  Family Communication: Discussed with patient.  Disposition Plan: Admit for observation.    KAKRAKANDY,ARSHAD N. [facility] Pager [phone].  If 7PM-7AM, please contact night-coverage [email or web address] Password [facility] 09/29/2014, 5:40 AM

## 2014-09-29 NOTE — ED Provider Notes (Signed)
CSN: [id number]     Arrival date & time 09/28/14  2225 History   First MD Initiated Contact with Patient 09/29/14 0055     Chief Complaint  Patient presents with  . Shortness of Breath     (Consider location/radiation/quality/duration/timing/severity/associated sxs/prior Treatment) HPI Olivia Gutierrez is a 59 y.o. female with past medical history of hypertension, hyperlipidemia, asthma presenting today with shortness of breath.  Patient states this occurred in church when she is sitting down and singing. She uses her albuterol inhaler at that time without any relief. After becoming short of breath, she had a needle and pin sensation throughout her body. She began to become dizzy as well. She denies any LOC. This entire event lasted approximately 30 minutes before terminating when she received nasal cannula oxygenation. Patient states this has occurred in the past however this was a little more severe. She feels like she was having heat exhaustion as the church was very hot at the time. Patient's had a normal appetite during the interval. She denied any chest pain, nausea, vomiting, or diaphoresis. She has no further complaints. Patient states she is back to baseline.  10 Systems reviewed and are negative for acute change except as noted in the HPI.    Past Medical History  Diagnosis Date  . Asthma   . Hypertension   . Hyperlipidemia   . Morbid obesity   . Anemia   . Diverticulosis   . Arthritis   . GERD (gastroesophageal reflux disease)    Past Surgical History  Procedure Laterality Date  . None     Family History  Problem Relation Age of Onset  . Heart attack Mother   . Stroke Father   . Heart failure Sister   . Heart attack Sister   . Lung disease Father   . Colon cancer Brother   . Cystic fibrosis Sister     x 4   History  Substance Use Topics  . Smoking status: Never Smoker   . Smokeless tobacco: Never Used  . Alcohol Use: No   OB History    No data  available     Review of Systems    Allergies  Other and Penicillins  Home Medications   Prior to Admission medications   Medication Sig Start Date End Date Taking? Authorizing Provider  albuterol (PROVENTIL HFA;VENTOLIN HFA) 108 (90 BASE) MCG/ACT inhaler Inhale 1-2 puffs into the lungs every 6 (six) hours as needed for wheezing or shortness of breath.   Yes Historical Provider, MD  ibuprofen (ADVIL,MOTRIN) 200 MG tablet Take 400 mg by mouth every 6 (six) hours as needed for mild pain or moderate pain.   Yes Historical Provider, MD  pantoprazole (PROTONIX) 40 MG tablet Take 1 tablet (40 mg total) by mouth 2 (two) times daily. 11/25/13  Yes Ladene Artist, MD  valsartan-hydrochlorothiazide (DIOVAN-HCT) 160-25 MG per tablet Take 1 tablet by mouth every morning. 08/17/14  Yes Historical Provider, MD  dicyclomine (BENTYL) 20 MG tablet Take 1 tablet (20 mg total) by mouth 2 (two) times daily. Patient not taking: Reported on 09/29/2014 12/29/13   Charlann Lange, PA-C  HYDROcodone-acetaminophen (NORCO/VICODIN) 5-325 MG per tablet Take 1-2 tablets by mouth every 4 (four) hours as needed. Patient not taking: Reported on 09/29/2014 12/29/13   Charlann Lange, PA-C  ondansetron (ZOFRAN) 4 MG tablet Take 1 tablet (4 mg total) by mouth every 6 (six) hours. Patient not taking: Reported on 09/29/2014 12/29/13   Charlann Lange, PA-C  ranitidine (ZANTAC) 300  MG tablet Take 1 tablet (300 mg total) by mouth at bedtime. Patient not taking: Reported on 09/29/2014 11/19/13   Ladene Artist, MD   BP 115/70 mmHg  Pulse 65  Temp(Src) 98 F (36.7 C) (Oral)  Resp 21  SpO2 100% Physical Exam  Constitutional: She is oriented to person, place, and time. She appears well-developed and well-nourished. No distress.  HENT:  Head: Normocephalic and atraumatic.  Nose: Nose normal.  Mouth/Throat: Oropharynx is clear and moist. No oropharyngeal exudate.  Eyes: Conjunctivae and EOM are normal. Pupils are equal, round, and  reactive to light. No scleral icterus.  Neck: Normal range of motion. Neck supple. No JVD present. No tracheal deviation present. No thyromegaly present.  Cardiovascular: Normal rate, regular rhythm and normal heart sounds.  Exam reveals no gallop and no friction rub.   No murmur heard. Pulmonary/Chest: Effort normal and breath sounds normal. No respiratory distress. She has no wheezes. She exhibits no tenderness.  Abdominal: Soft. Bowel sounds are normal. She exhibits no distension and no mass. There is no tenderness. There is no rebound and no guarding.  Musculoskeletal: Normal range of motion. She exhibits no edema or tenderness.  Lymphadenopathy:    She has no cervical adenopathy.  Neurological: She is alert and oriented to person, place, and time. No cranial nerve deficit. She exhibits normal muscle tone.  Skin: Skin is warm and dry. No rash noted. No erythema. No pallor.  Nursing note and vitals reviewed.   ED Course  Procedures (including critical care time) Labs Review Labs Reviewed  CBC WITH DIFFERENTIAL/PLATELET - Abnormal; Notable for the following:    Hemoglobin 10.8 (*)    HCT 33.4 (*)    All other components within normal limits  I-STAT CHEM 8, ED - Abnormal; Notable for the following:    Sodium 133 (*)    Potassium 3.4 (*)    Calcium, Ion 1.11 (*)    Hemoglobin 11.2 (*)    HCT 33.0 (*)    All other components within normal limits  CBG MONITORING, ED - Abnormal; Notable for the following:    Glucose-Capillary 104 (*)    All other components within normal limits  D-DIMER, QUANTITATIVE (NOT AT [facility])  Randolm Idol, ED    Imaging Review Dg Chest 2 View  09/29/2014   CLINICAL DATA:  59 year old female with shortness of breath  EXAM: CHEST  2 VIEW  COMPARISON:  Radiograph dated 12/29/2013  FINDINGS: The heart size and mediastinal contours are within normal limits. Both lungs are clear. The visualized skeletal structures are unremarkable.  IMPRESSION: No active  cardiopulmonary disease.   Electronically Signed   By: Anner Crete M.D.   On: 09/29/2014 01:18   Ct Angio Chest Pe W/cm &/or Wo Cm  09/29/2014   CLINICAL DATA:  Shortness of breath with pins and needle sensation all over the body.  EXAM: CT ANGIOGRAPHY CHEST WITH CONTRAST  TECHNIQUE: Multidetector CT imaging of the chest was performed using the standard protocol during bolus administration of intravenous contrast. Multiplanar CT image reconstructions and MIPs were obtained to evaluate the vascular anatomy.  CONTRAST:  26mL OMNIPAQUE IOHEXOL 350 MG/ML SOLN  COMPARISON:  None.  FINDINGS: Technically adequate study with good opacification of the central and segmental pulmonary arteries. No focal filling defects. No evidence of significant pulmonary embolus.  Mild cardiac enlargement. Normal caliber thoracic aorta. Great vessel origins are patent. Esophagus is decompressed. Small esophageal hiatal hernia. No significant lymphadenopathy in the chest.  Evaluation of lungs  is limited due to respiratory motion artifact. There is no gross consolidation or airspace disease. Airways are patent. No pleural effusion. No pneumothorax.  Included portions of the upper abdominal organs are grossly unremarkable. Degenerative changes in the spine. No destructive bone lesions.  Review of the MIP images confirms the above findings.  IMPRESSION: No evidence of significant pulmonary embolus. No evidence of active pulmonary disease.   Electronically Signed   By: Lucienne Capers M.D.   On: 09/29/2014 03:40     EKG Interpretation   Date/Time:  Thursday September 29 2014 01:30:04 EDT Ventricular Rate:  68 PR Interval:  112 QRS Duration: 104 QT Interval:  422 QTC Calculation: 449 R Axis:   64 Text Interpretation:  Sinus rhythm Abnrm T, consider ischemia,  anterolateral lds new TWI anterolateral leads Confirmed by Glynn Octave [phone]) on 09/29/2014 1:41:24 AM      MDM   Final diagnoses:  SOB (shortness of  breath)    Patient presents to the emergency department for shortness of breath. This occurred in a heart church while singing. EKG reveals T-wave inversions in the anterior lateral leads concerning for ACS. Troponin has been ordered. Dr. Einar Gip has been paged for admission.  Dr. Einar Gip recs for [facility] to [facility] but is requesting CTA for PE first.  This was ordered.  CTA was negative for PE. Will admit to try hospitalist for ACS rule out as per Dr. Irven Shelling request. I spoke with Dr. Jabier Mutton who will admit the patient to telemetry.  Everlene Balls, MD 09/29/14 [phone]

## 2014-09-29 NOTE — ED Notes (Signed)
Patient transported to CT 

## 2014-09-29 NOTE — Progress Notes (Signed)
Brief Rx note:  Lovenox Pt with Wt=129 kg, CrCl~96 ml/min  Plan: Increase Lovenox to 60mg  daily in pt with BMI=44  Thanks Dorrene German 09/29/2014 5:52 AM

## 2014-09-29 NOTE — Consult Note (Signed)
CARDIOLOGY CONSULT NOTE  Patient ID: Olivia Gutierrez MRN: [id number] DOB/AGE: 58-29-1957 58 y.o.  Admit date: 09/28/2014 Referring Physician  EDP/[facility] Primary Physician:  Benito Mccreedy, MD Reason for Consultation  SOB  HPI: Olivia Gutierrez  is a 59 y.o. female  With of morbid obesity, hypertension, hyperlipidemia, with recent negative nuclear stress test on 09/02/2014 for atypical chest pain and shortness of breath.  Patient recently came back from [place], her twin sister had cardiac arrest after taking excessive amounts of pain medications for chronic pain. She is presently on a ventilator and was just extubated this morning.  Patient herself was in the church last evening, when the choir was very hot, she felt dizzy, short of breath, felt very anxious, with tingling and numbness in the left part of the chest left part of the hand and feet. Due to worsening symptoms of dizziness, shortness of breath she presented to the emergency room.  Patient states that aspirin she came to the emergency room she felt well breathing fresh air. She has not had any recurrence of similar symptoms since being in the hospital. Presently feels well and denies any chest pain, shortness breath, PND or orthopnea. She states that she slept well relatively speaking.  Past Medical History  Diagnosis Date  . Asthma   . Hypertension   . Hyperlipidemia   . Morbid obesity   . Anemia   . Diverticulosis   . Arthritis   . GERD (gastroesophageal reflux disease)      Past Surgical History  Procedure Laterality Date  . None       Family History  Problem Relation Age of Onset  . Heart attack Mother   . Stroke Father   . Heart failure Sister   . Heart attack Sister   . Lung disease Father   . Colon cancer Brother   . Cystic fibrosis Sister     x 4     Social History: History   Social History  . Marital Status: Married    Spouse Name: N/A  . Number of Children: 0  .  Years of Education: N/A   Occupational History  . sociologist/ Education officer, museum    Social History Main Topics  . Smoking status: Never Smoker   . Smokeless tobacco: Never Used  . Alcohol Use: No  . Drug Use: No  . Sexual Activity: Yes    Birth Control/ Protection: Post-menopausal   Other Topics Concern  . Not on file   Social History Narrative     Prescriptions prior to admission  Medication Sig Dispense Refill Last Dose  . albuterol (PROVENTIL HFA;VENTOLIN HFA) 108 (90 BASE) MCG/ACT inhaler Inhale 1-2 puffs into the lungs every 6 (six) hours as needed for wheezing or shortness of breath.   09/28/2014 at Unknown time  . ibuprofen (ADVIL,MOTRIN) 200 MG tablet Take 400 mg by mouth every 6 (six) hours as needed for mild pain or moderate pain.   09/28/2014 at Unknown time  . pantoprazole (PROTONIX) 40 MG tablet Take 1 tablet (40 mg total) by mouth 2 (two) times daily. 60 tablet 5 09/28/2014 at Unknown time  . valsartan-hydrochlorothiazide (DIOVAN-HCT) 160-25 MG per tablet Take 1 tablet by mouth every morning.  2 09/28/2014 at Unknown time  . dicyclomine (BENTYL) 20 MG tablet Take 1 tablet (20 mg total) by mouth 2 (two) times daily. (Patient not taking: Reported on 09/29/2014) 20 tablet 0   . HYDROcodone-acetaminophen (NORCO/VICODIN) 5-325 MG per tablet Take 1-2 tablets by mouth every 4 (  four) hours as needed. (Patient not taking: Reported on 09/29/2014) 12 tablet 0   . ondansetron (ZOFRAN) 4 MG tablet Take 1 tablet (4 mg total) by mouth every 6 (six) hours. (Patient not taking: Reported on 09/29/2014) 12 tablet 0   . ranitidine (ZANTAC) 300 MG tablet Take 1 tablet (300 mg total) by mouth at bedtime. (Patient not taking: Reported on 09/29/2014) 90 tablet 0 12/28/2013 at Unknown time     ROS: General: no fevers/chills/night sweats Eyes: no blurry vision, diplopia, or amaurosis ENT: no sore throat or hearing loss Resp: no cough, wheezing, or hemoptysis, shortness of breath has resolved. Does have  mild chronic dyspnea and history of bronchial asthma. CV: no edema or palpitations GI: no abdominal pain, nausea, vomiting, diarrhea, or constipation GU: no dysuria, frequency, or hematuria Skin: no rash Neuro: no headache, numbness, tingling, or weakness of extremities Musculoskeletal: no joint pain or swelling Heme: no bleeding, DVT, or easy bruising Endo: no polydipsia or polyuria    Physical Exam: Blood pressure 118/67, pulse 67, temperature 97.6 F (36.4 C), temperature source Oral, resp. rate 14, height 5\' 7"  (1.702 m), weight 129.003 kg (284 lb 6.4 oz), SpO2 99 %.   General appearance: alert, cooperative, no distress and morbidly obese Lungs: clear to auscultation bilaterally Chest wall: no tenderness Heart: regular rate and rhythm, S1, S2 normal, no murmur, click, rub or gallop and Distant heart sounds Abdomen: soft, non-tender; bowel sounds normal; no masses,  no organomegaly and Obese, mild pannus present Extremities: extremities normal, atraumatic, no cyanosis or edema Neurologic: Grossly normal  Labs:   Lab Results  Component Value Date   WBC 5.6 09/29/2014   HGB 10.6* 09/29/2014   HCT 32.7* 09/29/2014   MCV 86.3 09/29/2014   PLT 238 09/29/2014    Recent Labs Lab 09/29/14 0640  NA 137  K 3.9  CL 106  CO2 26  BUN 13  CREATININE 0.81  CALCIUM 8.6*  PROT 5.9*  BILITOT 0.3  ALKPHOS 61  ALT 13*  AST 14*  GLUCOSE 94     Recent Labs  09/29/14 0640  TROPONINI <0.03    Lab Results  Component Value Date   TROPONINI <0.03 09/29/2014     EKG 09/28/2014: Sinus rhythm at a rate of 68bpm, normal axis, normal intervals, diffuse TWI, no change from outpatient EKG on 07/22/2014.  Outpatient Echo- 08/16/2014 1. Left ventricle cavity is normal in size. Moderate concentric hypertrophy of the left ventricle. Normal global wall motion. Calculated EF 54%. 2. Left atrial cavity is moderately dilated. 3. Mild mitral regurgitation. 4. Mild tricuspid regurgitation.  No evidence of pulmonary hypertension.  Outpatient Exercise sestamibi stress test 09/02/2014: 1. The resting electrocardiogram demonstrated normal sinus rhythm, normal resting conduction and nonspecific T changes. The stress electrocardiogram was normal.  The patient performed treadmill exercise using a Bruce protocol, completing 3:30 minutes. The patient completed an estimated workload of 5.19 METS, 90% of the maximum predicted heart rate. The stress test was terminated because of fatigue. 2. Myocardial perfusion imaging is normal. Overall left ventricular systolic function was normal without regional wall motion abnormalities. The left ventricular ejection fraction was 71%. The right ventricle appeared to show enlargement and may represent RVH.   Radiology: 09/29/2014   CLINICAL DATA:  59 year old female with shortness of breath  EXAM: CHEST  2 VIEW  COMPARISON:  Radiograph dated 12/29/2013  FINDINGS: The heart size and mediastinal contours are within normal limits. Both lungs are clear. The visualized skeletal structures are unremarkable.  IMPRESSION: No active cardiopulmonary disease.   Electronically Signed   By: Anner Crete M.D.   On: 09/29/2014 01:18   Ct Angio Chest Pe W/cm &/or Wo Cm  09/29/2014   CLINICAL DATA:  Shortness of breath with pins and needle sensation all over the body.   No focal filling defects. No evidence of significant pulmonary embolus.  Mild cardiac enlargement. Normal caliber thoracic aorta. Great vessel origins are patent. Esophagus is decompressed. Small esophageal hiatal hernia. No significant lymphadenopathy in the chest.  Evaluation of lungs is limited due to respiratory motion artifact. There is no gross consolidation or airspace disease. Airways are patent. No pleural effusion. No pneumothorax.  Included portions of the upper abdominal organs are grossly unremarkable. Degenerative changes in the spine. No destructive bone lesions.   Electronically Signed   By:  Lucienne Capers M.D.   On: 09/29/2014 03:40   Scheduled Meds: . aspirin EC  325 mg Oral Daily  . enoxaparin (LOVENOX) injection  60 mg Subcutaneous Daily  . irbesartan  150 mg Oral Daily   And  . hydrochlorothiazide  25 mg Oral Daily  . pantoprazole  40 mg Oral BID  . sodium chloride  3 mL Intravenous Q12H   Continuous Infusions:  PRN Meds:.acetaminophen **OR** acetaminophen, albuterol, ondansetron **OR** ondansetron (ZOFRAN) IV  ASSESSMENT AND PLAN:  1. SOB, dyspnea on exertion due to morbid obesity and obesity hypoventilation and history of bronchial asthma. Patient presently not having acute exacerbation. 2. Probably mild panic attack, patient's twin sister with cardiac arrest 4 days ago while patient visited her for a wedding, presently just extubated after being on a ventilator for 3 days. 3. HTN 4. Abnormal EKG, no significant change from baseline, serum troponin negative, patient presently completely asymptomatic. 5. Morbid Obesity  Recommendation: Pt presented to the ED last night after becoming SOB while singing at church. Pt recently evaluated in the office for similar symptoms with nuclear stress test negative for evidence of ischemia and essentially normal echo.  EKG abnormalities consistent with outpatient EKG 2 months ago with no acute changes. Do not suspect ACS.    I have reviewed the patient's history, at this point no change in physical exam, I have reassured her. From cardiac standpoint she can be discharged home. Adrian Prows, [place]

## 2014-09-29 NOTE — Discharge Summary (Signed)
Physician Discharge Summary  Olivia Gutierrez BZJ:[id number] DOB: 11-23-1955 DOA: 09/28/2014  PCP: Benito Mccreedy, MD  Admit date: 09/28/2014 Discharge date: 09/29/2014  Time spent: *25 minutes  Recommendations for Outpatient Follow-up:  1. *Follow-up PCP in 2 weeks  Discharge Diagnoses:  Principal Problem:   SOB (shortness of breath) Active Problems:   Essential hypertension   Chronic anemia   Discharge Condition: Stable  Diet recommendation: Low-salt diet  Filed Weights   09/29/14 0534  Weight: 129.003 kg (284 lb 6.4 oz)    History of present illness:  59 y.o. female with history of hypertension and chronic anemia presents to the ER because of shortness of breath. Patient states she was in the church last evening when suddenly patient started developing shortness of breath with flushed sensation. Patient's symptoms persisted until patient HDL and was given oxygen. Denies any chest pain and productive cough fever chills. In the ER CT angiogram of the chest was unremarkable. EKG was showing flipped T waves. Patient's cardiologist Dr. Einar Gip was consulted by the ER physician and at this time since patient's EKG shows flipped T waves patient has been admitted to rule out ACS. Patient has had a stress test done 2 weeks ago which as per the patient was unremarkable.   Hospital Course:   Shortness of breath Patient presented with mild shortness of breath like you from panic attack as she complained of tingling in the fingertips and hyperventilation at church while singing. CT angiogram was negative for pulmonary embolism, patient was seen by cardiology and they have cleared the patient for discharge. Cardiac enzymes have been negative. Patient recently had nuclear stress test done 2 weeks ago which was negative.  Hypokalemia Potassium was replaced, repeat potassium 3.9   Procedures:  None  Consultations:  None  Discharge Exam: Filed Vitals:   09/29/14 0534  BP:  118/67  Pulse: 67  Temp: 97.6 F (36.4 C)  Resp: 14    General: *Appear in no acute distress Cardiovascular: S1-S2 regular Respiratory: Clear to auscultation bilaterally  Discharge Instructions    Current Discharge Medication List    CONTINUE these medications which have NOT CHANGED   Details  albuterol (PROVENTIL HFA;VENTOLIN HFA) 108 (90 BASE) MCG/ACT inhaler Inhale 1-2 puffs into the lungs every 6 (six) hours as needed for wheezing or shortness of breath.    ibuprofen (ADVIL,MOTRIN) 200 MG tablet Take 400 mg by mouth every 6 (six) hours as needed for mild pain or moderate pain.    pantoprazole (PROTONIX) 40 MG tablet Take 1 tablet (40 mg total) by mouth 2 (two) times daily. Qty: 60 tablet, Refills: 5    valsartan-hydrochlorothiazide (DIOVAN-HCT) 160-25 MG per tablet Take 1 tablet by mouth every morning. Refills: 2    dicyclomine (BENTYL) 20 MG tablet Take 1 tablet (20 mg total) by mouth 2 (two) times daily. Qty: 20 tablet, Refills: 0    HYDROcodone-acetaminophen (NORCO/VICODIN) 5-325 MG per tablet Take 1-2 tablets by mouth every 4 (four) hours as needed. Qty: 12 tablet, Refills: 0    ondansetron (ZOFRAN) 4 MG tablet Take 1 tablet (4 mg total) by mouth every 6 (six) hours. Qty: 12 tablet, Refills: 0    ranitidine (ZANTAC) 300 MG tablet Take 1 tablet (300 mg total) by mouth at bedtime. Qty: 90 tablet, Refills: 0   Associated Diagnoses: Gastroesophageal reflux disease with esophagitis; Other dysphagia; Stricture and stenosis of esophagus       Allergies  Allergen Reactions  . Other Shortness Of Breath and Itching  Certain peanuts.. Pecans, walnuts  . Penicillins     Unknown   Follow-up Information    Follow up with OSEI-BONSU,GEORGE, MD. Schedule an appointment as soon as possible for a visit in 3 days.   Specialty:  Internal Medicine   Why:  for close follow up of your shortness of breath   Contact information:   [address] [place]  [address] [phone]        The results of significant diagnostics from this hospitalization (including imaging, microbiology, ancillary and laboratory) are listed below for reference.    Significant Diagnostic Studies: Dg Chest 2 View  09/29/2014   CLINICAL DATA:  59 year old female with shortness of breath  EXAM: CHEST  2 VIEW  COMPARISON:  Radiograph dated 12/29/2013  FINDINGS: The heart size and mediastinal contours are within normal limits. Both lungs are clear. The visualized skeletal structures are unremarkable.  IMPRESSION: No active cardiopulmonary disease.   Electronically Signed   By: Anner Crete M.D.   On: 09/29/2014 01:18   Ct Angio Chest Pe W/cm &/or Wo Cm  09/29/2014   CLINICAL DATA:  Shortness of breath with pins and needle sensation all over the body.  EXAM: CT ANGIOGRAPHY CHEST WITH CONTRAST  TECHNIQUE: Multidetector CT imaging of the chest was performed using the standard protocol during bolus administration of intravenous contrast. Multiplanar CT image reconstructions and MIPs were obtained to evaluate the vascular anatomy.  CONTRAST:  21mL OMNIPAQUE IOHEXOL 350 MG/ML SOLN  COMPARISON:  None.  FINDINGS: Technically adequate study with good opacification of the central and segmental pulmonary arteries. No focal filling defects. No evidence of significant pulmonary embolus.  Mild cardiac enlargement. Normal caliber thoracic aorta. Great vessel origins are patent. Esophagus is decompressed. Small esophageal hiatal hernia. No significant lymphadenopathy in the chest.  Evaluation of lungs is limited due to respiratory motion artifact. There is no gross consolidation or airspace disease. Airways are patent. No pleural effusion. No pneumothorax.  Included portions of the upper abdominal organs are grossly unremarkable. Degenerative changes in the spine. No destructive bone lesions.  Review of the MIP images confirms the above findings.  IMPRESSION: No evidence of significant pulmonary  embolus. No evidence of active pulmonary disease.   Electronically Signed   By: Lucienne Capers M.D.   On: 09/29/2014 03:40    Microbiology: No results found for this or any previous visit (from the past 240 hour(s)).   Labs: Basic Metabolic Panel:  Recent Labs Lab 09/29/14 0040 09/29/14 0640  NA 133* 137  K 3.4* 3.9  CL 102 106  CO2  --  26  GLUCOSE 98 94  BUN 15 13  CREATININE 0.90 0.81  CALCIUM  --  8.6*   Liver Function Tests:  Recent Labs Lab 09/29/14 0640  AST 14*  ALT 13*  ALKPHOS 61  BILITOT 0.3  PROT 5.9*  ALBUMIN 3.2*   No results for input(s): LIPASE, AMYLASE in the last 168 hours. No results for input(s): AMMONIA in the last 168 hours. CBC:  Recent Labs Lab 09/29/14 0027 09/29/14 0040 09/29/14 0640  WBC 6.8  --  5.6  NEUTROABS 3.7  --  3.1  HGB 10.8* 11.2* 10.6*  HCT 33.4* 33.0* 32.7*  MCV 84.8  --  86.3  PLT 264  --  238   Cardiac Enzymes:  Recent Labs Lab 09/29/14 0640 09/29/14 1220  TROPONINI <0.03 <0.03   BNP: BNP (last 3 results) No results for input(s): BNP in the last 8760  hours.  ProBNP (last 3 results) No results for input(s): PROBNP in the last 8760 hours.  CBG:  Recent Labs Lab 09/29/14 0033  GLUCAP 104*       Signed:  Nakaya Mishkin S  [facility] 09/29/2014, 1:05 PM

## 2014-09-29 NOTE — ED Notes (Signed)
Patient reports that she feels much better.  She verbalizes that pins/needles sensation started after she began breathing fast.  Denies history of anxiety/panic attacks.

## 2014-09-29 NOTE — ED Notes (Signed)
Notified EDP,Oni,MD., pt. i-stat Chem 8 results potassium 3.4 and hemoglobin 11.2.

## 2015-05-04 ENCOUNTER — Other Ambulatory Visit: Payer: Self-pay | Admitting: Orthopaedic Surgery

## 2015-05-04 DIAGNOSIS — M25562 Pain in left knee: Secondary | ICD-10-CM

## 2015-05-21 ENCOUNTER — Ambulatory Visit
Admission: RE | Admit: 2015-05-21 | Discharge: 2015-05-21 | Disposition: A | Discharge status: Home / Self Care | Payer: Self-pay | Source: Ambulatory Visit | Attending: Orthopaedic Surgery | Admitting: Orthopaedic Surgery

## 2015-05-21 DIAGNOSIS — M25562 Pain in left knee: Secondary | ICD-10-CM

## 2015-07-24 DIAGNOSIS — J45909 Unspecified asthma, uncomplicated: Secondary | ICD-10-CM | POA: Insufficient documentation

## 2015-07-24 DIAGNOSIS — Z862 Personal history of diseases of the blood and blood-forming organs and certain disorders involving the immune mechanism: Secondary | ICD-10-CM | POA: Insufficient documentation

## 2015-07-24 DIAGNOSIS — I10 Essential (primary) hypertension: Secondary | ICD-10-CM | POA: Insufficient documentation

## 2015-07-24 DIAGNOSIS — Z880 Allergy status to penicillin: Secondary | ICD-10-CM | POA: Insufficient documentation

## 2015-07-24 DIAGNOSIS — Z79899 Other long term (current) drug therapy: Secondary | ICD-10-CM | POA: Insufficient documentation

## 2015-07-24 DIAGNOSIS — M1990 Unspecified osteoarthritis, unspecified site: Secondary | ICD-10-CM | POA: Insufficient documentation

## 2015-07-24 DIAGNOSIS — K219 Gastro-esophageal reflux disease without esophagitis: Secondary | ICD-10-CM | POA: Insufficient documentation

## 2015-07-24 DIAGNOSIS — R630 Anorexia: Secondary | ICD-10-CM | POA: Insufficient documentation

## 2015-07-24 DIAGNOSIS — K5733 Diverticulitis of large intestine without perforation or abscess with bleeding: Secondary | ICD-10-CM | POA: Insufficient documentation

## 2015-07-25 ENCOUNTER — Emergency Department (HOSPITAL_COMMUNITY): Payer: Self-pay

## 2015-07-25 ENCOUNTER — Emergency Department (HOSPITAL_COMMUNITY)
Admission: EM | Admit: 2015-07-25 | Discharge: 2015-07-25 | Disposition: A | Discharge status: Home / Self Care | Payer: Self-pay | Attending: Emergency Medicine | Admitting: Emergency Medicine

## 2015-07-25 ENCOUNTER — Encounter (HOSPITAL_COMMUNITY): Payer: Self-pay | Admitting: Emergency Medicine

## 2015-07-25 DIAGNOSIS — K5733 Diverticulitis of large intestine without perforation or abscess with bleeding: Secondary | ICD-10-CM

## 2015-07-25 LAB — URINALYSIS, ROUTINE W REFLEX MICROSCOPIC
BILIRUBIN URINE: NEGATIVE
GLUCOSE, UA: NEGATIVE mg/dL
HGB URINE DIPSTICK: NEGATIVE
Ketones, ur: NEGATIVE mg/dL
Leukocytes, UA: NEGATIVE
Nitrite: NEGATIVE
PROTEIN: NEGATIVE mg/dL
Specific Gravity, Urine: 1.021 (ref 1.005–1.030)
pH: 7 (ref 5.0–8.0)

## 2015-07-25 LAB — CBC
HCT: 35.2 % — ABNORMAL LOW (ref 36.0–46.0)
Hemoglobin: 11.3 g/dL — ABNORMAL LOW (ref 12.0–15.0)
MCH: 26.7 pg (ref 26.0–34.0)
MCHC: 32.1 g/dL (ref 30.0–36.0)
MCV: 83 fL (ref 78.0–100.0)
PLATELETS: 285 10*3/uL (ref 150–400)
RBC: 4.24 MIL/uL (ref 3.87–5.11)
RDW: 14.3 % (ref 11.5–15.5)
WBC: 7 10*3/uL (ref 4.0–10.5)

## 2015-07-25 LAB — COMPREHENSIVE METABOLIC PANEL
ALBUMIN: 3.7 g/dL (ref 3.5–5.0)
ALK PHOS: 69 U/L (ref 38–126)
ALT: 14 U/L (ref 14–54)
AST: 14 U/L — AB (ref 15–41)
Anion gap: 8 (ref 5–15)
BUN: 11 mg/dL (ref 6–20)
CALCIUM: 9.2 mg/dL (ref 8.9–10.3)
CO2: 24 mmol/L (ref 22–32)
CREATININE: 0.66 mg/dL (ref 0.44–1.00)
Chloride: 107 mmol/L (ref 101–111)
GFR calc Af Amer: >60 mL/min (ref >60)
GFR calc non Af Amer: >60 mL/min (ref >60)
GLUCOSE: 104 mg/dL — AB (ref 65–99)
Potassium: 4.2 mmol/L (ref 3.5–5.1)
SODIUM: 139 mmol/L (ref 135–145)
Total Bilirubin: 0.4 mg/dL (ref 0.3–1.2)
Total Protein: 6.7 g/dL (ref 6.5–8.1)

## 2015-07-25 LAB — LIPASE, BLOOD: LIPASE: 14 U/L (ref 11–51)

## 2015-07-25 LAB — POC OCCULT BLOOD, ED: Fecal Occult Bld: POSITIVE — AB

## 2015-07-25 MED ORDER — ONDANSETRON HCL 4 MG/2ML IJ SOLN
4.0000 mg | Freq: Once | INTRAMUSCULAR | Status: AC
  Administered 2015-07-25: 4 mg via INTRAVENOUS
  Filled 2015-07-25: qty 2

## 2015-07-25 MED ORDER — DIATRIZOATE MEGLUMINE & SODIUM 66-10 % PO SOLN
15.0000 mL | Freq: Once | ORAL | Status: DC
Start: 2015-07-25 — End: 2015-07-25

## 2015-07-25 MED ORDER — SODIUM CHLORIDE 0.9 % IV BOLUS (SEPSIS)
1000.0000 mL | Freq: Once | INTRAVENOUS | Status: AC
  Administered 2015-07-25: 1000 mL via INTRAVENOUS

## 2015-07-25 MED ORDER — METRONIDAZOLE 500 MG PO TABS
500.0000 mg | ORAL_TABLET | Freq: Once | ORAL | Status: AC
  Administered 2015-07-25: 500 mg via ORAL
  Filled 2015-07-25: qty 1

## 2015-07-25 MED ORDER — METRONIDAZOLE IN NACL 5-0.79 MG/ML-% IV SOLN
500.0000 mg | Freq: Once | INTRAVENOUS | Status: AC
  Administered 2015-07-25: 500 mg via INTRAVENOUS
  Filled 2015-07-25: qty 100

## 2015-07-25 MED ORDER — METRONIDAZOLE 500 MG PO TABS: 500.0000 mg | ORAL_TABLET | Freq: Two times a day (BID) | ORAL | Status: DC

## 2015-07-25 MED ORDER — OXYCODONE-ACETAMINOPHEN 5-325 MG PO TABS
1.0000 | ORAL_TABLET | ORAL | Status: DC | PRN
  Administered 2015-07-25: 1 via ORAL
  Filled 2015-07-25: qty 1

## 2015-07-25 MED ORDER — IOPAMIDOL (ISOVUE-300) INJECTION 61%
100.0000 mL | Freq: Once | INTRAVENOUS | Status: AC | PRN
  Administered 2015-07-25: 100 mL via INTRAVENOUS

## 2015-07-25 MED ORDER — CIPROFLOXACIN IN D5W 400 MG/200ML IV SOLN
400.0000 mg | Freq: Once | INTRAVENOUS | Status: AC
  Administered 2015-07-25: 400 mg via INTRAVENOUS
  Filled 2015-07-25: qty 200

## 2015-07-25 MED ORDER — POLYETHYLENE GLYCOL 3350 17 G PO PACK: 17.0000 g | PACK | Freq: Every day | ORAL | Status: DC

## 2015-07-25 MED ORDER — CIPROFLOXACIN HCL 500 MG PO TABS: 500.0000 mg | ORAL_TABLET | Freq: Two times a day (BID) | ORAL | Status: DC

## 2015-07-25 MED ORDER — MORPHINE SULFATE (PF) 4 MG/ML IV SOLN
4.0000 mg | Freq: Once | INTRAVENOUS | Status: AC
  Administered 2015-07-25: 4 mg via INTRAVENOUS
  Filled 2015-07-25: qty 1

## 2015-07-25 MED ORDER — DIATRIZOATE MEGLUMINE & SODIUM 66-10 % PO SOLN
30.0000 mL | Freq: Once | ORAL | Status: AC
  Administered 2015-07-25: 30 mL via ORAL

## 2015-07-25 MED ORDER — ONDANSETRON HCL 4 MG PO TABS: 4.0000 mg | ORAL_TABLET | Freq: Three times a day (TID) | ORAL | Status: DC | PRN

## 2015-07-25 MED ORDER — MORPHINE SULFATE (PF) 4 MG/ML IV SOLN
6.0000 mg | Freq: Once | INTRAVENOUS | Status: AC
  Administered 2015-07-25: 6 mg via INTRAVENOUS
  Filled 2015-07-25: qty 2

## 2015-07-25 MED ORDER — CIPROFLOXACIN HCL 500 MG PO TABS
500.0000 mg | ORAL_TABLET | Freq: Once | ORAL | Status: AC
  Administered 2015-07-25: 500 mg via ORAL
  Filled 2015-07-25: qty 1

## 2015-07-25 NOTE — ED Notes (Signed)
Pain medication given in Triage. Patient advised about side effects of medications and  to avoid driving for a minimum of 4 hours.  

## 2015-07-25 NOTE — ED Notes (Signed)
PT TOLERATED PO CHALLENGE. DENIES N/V AT THIS TIME.

## 2015-07-25 NOTE — ED Notes (Signed)
PO CHALLENGE STARTED WITH WATER AND SALTINE CRACKERS.

## 2015-07-25 NOTE — ED Notes (Signed)
PT DISCHARGED. INSTRUCTIONS AND PRESCRIPTIONS GIVEN. AAOX3. PT IN NO APPARENT DISTRESS. THE OPPORTUNITY TO ASK QUESTIONS WAS PROVIDED. 

## 2015-07-25 NOTE — Discharge Instructions (Signed)
Diverticulitis °Diverticulitis is inflammation or infection of small pouches in your colon that form when you have a condition called diverticulosis. The pouches in your colon are called diverticula. Your colon, or large intestine, is where water is absorbed and stool is formed. °Complications of diverticulitis can include: °· Bleeding. °· Severe infection. °· Severe pain. °· Perforation of your colon. °· Obstruction of your colon. °CAUSES  °Diverticulitis is caused by bacteria. °Diverticulitis happens when stool becomes trapped in diverticula. This allows bacteria to grow in the diverticula, which can lead to inflammation and infection. °RISK FACTORS °People with diverticulosis are at risk for diverticulitis. Eating a diet that does not include enough fiber from fruits and vegetables may make diverticulitis more likely to develop. °SYMPTOMS  °Symptoms of diverticulitis may include: °· Abdominal pain and tenderness. The pain is normally located on the left side of the abdomen, but may occur in other areas. °· Fever and chills. °· Bloating. °· Cramping. °· Nausea. °· Vomiting. °· Constipation. °· Diarrhea. °· Blood in your stool. °DIAGNOSIS  °Your health care provider will ask you about your medical history and do a physical exam. You may need to have tests done because many medical conditions can cause the same symptoms as diverticulitis. Tests may include: °· Blood tests. °· Urine tests. °· Imaging tests of the abdomen, including X-rays and CT scans. °When your condition is under control, your health care provider may recommend that you have a colonoscopy. A colonoscopy can show how severe your diverticula are and whether something else is causing your symptoms. °TREATMENT  °Most cases of diverticulitis are mild and can be treated at home. Treatment may include: °· Taking over-the-counter pain medicines. °· Following a clear liquid diet. °· Taking antibiotic medicines by mouth for 7-10 days. °More severe cases may  be treated at a hospital. Treatment may include: °· Not eating or drinking. °· Taking prescription pain medicine. °· Receiving antibiotic medicines through an IV tube. °· Receiving fluids and nutrition through an IV tube. °· Surgery. °HOME CARE INSTRUCTIONS  °· Follow your health care provider's instructions carefully. °· Follow a full liquid diet or other diet as directed by your health care provider. After your symptoms improve, your health care provider may tell you to change your diet. He or she may recommend you eat a high-fiber diet. Fruits and vegetables are good sources of fiber. Fiber makes it easier to pass stool. °· Take fiber supplements or probiotics as directed by your health care provider. °· Only take medicines as directed by your health care provider. °· Keep all your follow-up appointments. °SEEK MEDICAL CARE IF:  °· Your pain does not improve. °· You have a hard time eating food. °· Your bowel movements do not return to normal. °SEEK IMMEDIATE MEDICAL CARE IF:  °· Your pain becomes worse. °· Your symptoms do not get better. °· Your symptoms suddenly get worse. °· You have a fever. °· You have repeated vomiting. °· You have bloody or black, tarry stools. °MAKE SURE YOU:  °· Understand these instructions. °· Will watch your condition. °· Will get help right away if you are not doing well or get worse. °  °This information is not intended to replace advice given to you by your health care provider. Make sure you discuss any questions you have with your health care provider. °  °Document Released: 12/12/2004 Document Revised: 03/09/2013 Document Reviewed: 01/27/2013 °[employer]vier Interactive Patient Education ©2016 [employer] ° °

## 2015-07-25 NOTE — ED Provider Notes (Signed)
CSN: [id number]     Arrival date & time 07/24/15  2348 History   First MD Initiated Contact with Patient 07/25/15 [id number]     Chief Complaint  Patient presents with  . Constipation  . Abdominal Pain     (Consider location/radiation/quality/duration/timing/severity/associated sxs/prior Treatment) HPI   60 year old female who is morbidly obese, with history of diverticulosis, GERD, hyperlipidemia, anemia and asthma presenting with complaint of abdominal pain. Patient reports the past 2 days she has had progressive worsening left-sided abdominal pain with constipation. She described pain as a sharp aching sensation, waxing and waning currently 8 out of 10 after receiving some pain medication while waiting the ED. She endorse nausea without vomiting. She endorse having black stool that she has to manually disimpact yesterday. Reports increase bloating and belching. Also reported having chills and decrease in appetite. Patient attributed her symptoms due to eating unhealthy including fried food and drinking her teeth regularly. She normally has daily BM.  She denies any abdominal surgery. No complaint of chest pain, shortness of breath, productive cough, dysuria, vaginal bleeding or vaginal discharge. No prior abdominal surgery no prior history of small bowel obstruction.  Past Medical History  Diagnosis Date  . Asthma   . Hypertension   . Hyperlipidemia   . Morbid obesity ([place])   . Anemia   . Diverticulosis   . Arthritis   . GERD (gastroesophageal reflux disease)    Past Surgical History  Procedure Laterality Date  . None     Family History  Problem Relation Age of Onset  . Heart attack Mother   . Stroke Father   . Heart failure Sister   . Heart attack Sister   . Lung disease Father   . Colon cancer Brother   . Cystic fibrosis Sister     x 4   Social History  Substance Use Topics  . Smoking status: Never Smoker   . Smokeless tobacco: Never Used  . Alcohol Use: No   OB History     No data available     Review of Systems  All other systems reviewed and are negative.     Allergies  Other and Penicillins  Home Medications   Prior to Admission medications   Medication Sig Start Date End Date Taking? Authorizing Provider  albuterol (PROVENTIL HFA;VENTOLIN HFA) 108 (90 BASE) MCG/ACT inhaler Inhale 1-2 puffs into the lungs every 6 (six) hours as needed for wheezing or shortness of breath.    Historical Provider, MD  dicyclomine (BENTYL) 20 MG tablet Take 1 tablet (20 mg total) by mouth 2 (two) times daily. Patient not taking: Reported on 09/29/2014 12/29/13   Charlann Lange, PA-C  HYDROcodone-acetaminophen (NORCO/VICODIN) 5-325 MG per tablet Take 1-2 tablets by mouth every 4 (four) hours as needed. Patient not taking: Reported on 09/29/2014 12/29/13   Charlann Lange, PA-C  ibuprofen (ADVIL,MOTRIN) 200 MG tablet Take 400 mg by mouth every 6 (six) hours as needed for mild pain or moderate pain.    Historical Provider, MD  ondansetron (ZOFRAN) 4 MG tablet Take 1 tablet (4 mg total) by mouth every 6 (six) hours. Patient not taking: Reported on 09/29/2014 12/29/13   Charlann Lange, PA-C  pantoprazole (PROTONIX) 40 MG tablet Take 1 tablet (40 mg total) by mouth 2 (two) times daily. 11/25/13   Ladene Artist, MD  ranitidine (ZANTAC) 300 MG tablet Take 1 tablet (300 mg total) by mouth at bedtime. Patient not taking: Reported on 09/29/2014 11/19/13   Ladene Artist, MD  valsartan-hydrochlorothiazide (DIOVAN-HCT) 160-25 MG per tablet Take 1 tablet by mouth every morning. 08/17/14   Historical Provider, MD   BP 144/88 mmHg  Pulse 76  Temp(Src) 98.2 F (36.8 C) (Oral)  Resp 20  SpO2 100% Physical Exam  Constitutional: She appears well-developed and well-nourished. No distress.  Morbidly obese African-American female laying in bed appears to be some mild abdominal discomfort.  HENT:  Head: Atraumatic.  Eyes: Conjunctivae are normal.  Neck: Neck supple.  Cardiovascular: Normal  rate and regular rhythm.   Pulmonary/Chest: Effort normal and breath sounds normal.  Abdominal: Soft. Bowel sounds are normal. She exhibits no distension. There is tenderness (Tenderness to left upper and left lower abdominal quadrant on palpation without guarding or rebound tenderness. Negative Murphy sign, no pain at McBurney's point.).  Genitourinary:  Chaperone present during exam. Normal rectal tone, no stool in rectal vault, normal color stool on glove and Hemoccult negative. No mass obvious obstruction noted however exam is limited due to large body habitus.  Neurological: She is alert.  Skin: No rash noted.  Psychiatric: She has a normal mood and affect.  Nursing note and vitals reviewed.   ED Course  Procedures (including critical care time) Labs Review Labs Reviewed  COMPREHENSIVE METABOLIC PANEL - Abnormal; Notable for the following:    Glucose, Bld 104 (*)    AST 14 (*)    All other components within normal limits  CBC - Abnormal; Notable for the following:    Hemoglobin 11.3 (*)    HCT 35.2 (*)    All other components within normal limits  POC OCCULT BLOOD, ED - Abnormal; Notable for the following:    Fecal Occult Bld POSITIVE (*)    All other components within normal limits  LIPASE, BLOOD  URINALYSIS, ROUTINE W REFLEX MICROSCOPIC (NOT AT [facility])    Imaging Review Ct Abdomen Pelvis W Contrast  07/25/2015  CLINICAL DATA:  Constipation and lower abdominal pain. History of diverticulitis. EXAM: CT ABDOMEN AND PELVIS WITH CONTRAST TECHNIQUE: Multidetector CT imaging of the abdomen and pelvis was performed using the standard protocol following bolus administration of intravenous contrast. CONTRAST:  119mL ISOVUE-300 IOPAMIDOL (ISOVUE-300) INJECTION 61% COMPARISON:  07/23/2007 FINDINGS: Lower chest and abdominal wall:  No contributory findings. Hepatobiliary: No focal liver abnormality.No evidence of biliary obstruction or stone. Pancreas: Unremarkable. Spleen: Unremarkable.  Adrenals/Urinary Tract: Negative adrenals. No hydronephrosis or stone. At least partially duplicated left ureter. Unremarkable bladder. Reproductive:No acute finding Stomach/Bowel: Extensive colonic diverticulosis with active inflammation along the distal descending segment where there is anterior wall thickening and circumferential pericolonic edema. There is formed stool throughout the more proximal colon correlating with constipation history. No small bowel obstruction. No appendicitis. Vascular/Lymphatic: No acute vascular abnormality. No mass or adenopathy. Peritoneal: Small pelvic fluid considered reactive. Musculoskeletal: No acute abnormalities. Lower lumbar facet arthropathy and milder disc degeneration. IMPRESSION: Uncomplicated descending diverticulitis. Electronically Signed   By: Monte Fantasia M.D.   On: 07/25/2015 08:48   I have personally reviewed and evaluated these images and lab results as part of my medical decision-making.   EKG Interpretation None      MDM   Final diagnoses:  Diverticulitis of large intestine without perforation or abscess with bleeding    BP 116/75 mmHg  Pulse 80  Temp(Src) 98.2 F (36.8 C) (Oral)  Resp 20  SpO2 98%   7:16 AM Patient here with left-sided abdominal pain along with constipation and history of diverticulosis. Plan to obtain abdominal and pelvis CT scan for further  evaluation. Workup initiated.  10:43 AM Fecal occult blood test is positive. Labs are otherwise reassuring. CT scan of the abdomen and pelvis show an uncomplicated descending diverticulitis. Patient was given Cipro and Flagyl pills  12:36 PM Initially patient unable to tolerates by mouth and vomited after receiving Cipro and Flagyl by mouth. Patient received Cipro and Flagyl via IV as well as antinausea medication. Patient now felt better, able to tolerates by mouth and stable for discharge. Patient will continue to take antibiotic as prescribed. Return precaution  discussed. Recommend taking Mirapex to help with constipation.  Domenic Moras, PA-C 07/25/15 [place], MD 07/25/15 [phone]

## 2015-07-25 NOTE — ED Notes (Signed)
Pt states that she has had constipation for +/- a week and has abdominal pain. Hx of diverticulitis. Alert and oriented.

## 2016-08-07 ENCOUNTER — Ambulatory Visit (INDEPENDENT_AMBULATORY_CARE_PROVIDER_SITE_OTHER): Payer: Self-pay

## 2016-08-07 ENCOUNTER — Encounter (HOSPITAL_COMMUNITY): Payer: Self-pay | Admitting: Emergency Medicine

## 2016-08-07 ENCOUNTER — Ambulatory Visit (HOSPITAL_COMMUNITY)
Admission: EM | Admit: 2016-08-07 | Discharge: 2016-08-07 | Disposition: A | Discharge status: Home / Self Care | Payer: Self-pay | Attending: Family Medicine | Admitting: Family Medicine

## 2016-08-07 DIAGNOSIS — R1030 Lower abdominal pain, unspecified: Secondary | ICD-10-CM

## 2016-08-07 DIAGNOSIS — K5904 Chronic idiopathic constipation: Secondary | ICD-10-CM

## 2016-08-07 LAB — POCT URINALYSIS DIP (DEVICE)
Bilirubin Urine: NEGATIVE
Glucose, UA: NEGATIVE mg/dL
Ketones, ur: NEGATIVE mg/dL
Nitrite: NEGATIVE
PROTEIN: 30 mg/dL — AB
Specific Gravity, Urine: 1.02 (ref 1.005–1.030)
Urobilinogen, UA: 0.2 mg/dL (ref 0.0–1.0)
pH: 7.5 (ref 5.0–8.0)

## 2016-08-07 NOTE — ED Triage Notes (Signed)
Onset last week of diarrhea virus.  Diarrhea has stopped.  Lower abdomen and rectal pressure.  Patient pas had cold and hot spells

## 2016-08-07 NOTE — Discharge Instructions (Signed)
The most likely reason for your abdominal pain is constipation. X-rays show that you have much stool in your colon. No tenderness to the left lower quadrant or fever to suggest an acute episode of diverticulitis at this time. You will need to start taking stool softeners 3 times a day and using MiraLAX. He may have to use MiraLAX up to 3 glasses within an hour and a half. If you are not having a good bowel movement in 6-8 hours and repeat another 3 glasses. Read instructions on high fiber diet. Follow-up with primary care doctor as needed. If he get worse, had new symptoms or problems, vomiting or see blood in your stool then you should go to emergency department.

## 2016-08-07 NOTE — ED Provider Notes (Signed)
CSN: [id number]     Arrival date & time 08/07/16  4098 History   First MD Initiated Contact with Patient 08/07/16 1046     Chief Complaint  Patient presents with  . Abdominal Pain   (Consider location/radiation/quality/duration/timing/severity/associated sxs/prior Treatment) 61 year old morbidly obese female, poor historian, states that over 1-1/2 weeks ago she had GI symptoms primarily diarrhea for about 5 days. It stopped last week. The reason she is coming in today is because of pressure in the rectum, low pelvis and across the lower abdomen for the past 2 days. Occasionally frequent urination make stand. She states her stools are not regular and is difficult for her to explain whether she may or may not have constipation. She has a history of GERD, anemia, asthma, constipation and chronic intermittent left abdominal pain for which she has been seen several times. She does have a gastroenterologist. She was seen in the emergency department proximately 2 weeks ago and treated for diverticulosis with antibiotics.      Past Medical History:  Diagnosis Date  . Anemia   . Arthritis   . Asthma   . Diverticulosis   . GERD (gastroesophageal reflux disease)   . Hyperlipidemia   . Hypertension   . Morbid obesity ([place])    Past Surgical History:  Procedure Laterality Date  . none     Family History  Problem Relation Age of Onset  . Heart attack Mother   . Stroke Father   . Lung disease Father   . Heart failure Sister   . Heart attack Sister   . Colon cancer Brother   . Cystic fibrosis Sister        x 4   Social History  Substance Use Topics  . Smoking status: Never Smoker  . Smokeless tobacco: Never Used  . Alcohol use No   OB History    No data available     Review of Systems  Constitutional: Positive for activity change. Negative for fatigue and fever.  HENT: Negative.   Respiratory: Negative.   Cardiovascular: Negative for chest pain.  Gastrointestinal: Positive for  abdominal pain and diarrhea. Negative for abdominal distention.  Genitourinary: Positive for frequency. Negative for decreased urine volume, dysuria and vaginal discharge.  Musculoskeletal: Negative.   All other systems reviewed and are negative.   Allergies  Other and Penicillins  Home Medications   Prior to Admission medications   Medication Sig Start Date End Date Taking? Authorizing Provider  albuterol (PROVENTIL HFA;VENTOLIN HFA) 108 (90 BASE) MCG/ACT inhaler Inhale 1-2 puffs into the lungs every 6 (six) hours as needed for wheezing or shortness of breath.    [provider]  ibuprofen (ADVIL,MOTRIN) 200 MG tablet Take 400 mg by mouth every 6 (six) hours as needed for mild pain or moderate pain.    [provider]   Meds Ordered and Administered this Visit  Medications - No data to display  BP 122/83 (BP Location: Left Arm) Comment: out of blood pressure medicine Comment (BP Location): large cuff  Pulse 74   Temp 98.2 F (36.8 C) (Oral)   Resp (!) 22   SpO2 98%  No data found.   Physical Exam  Constitutional: She is oriented to person, place, and time. She appears well-developed and well-nourished. No distress.  Eyes: EOM are normal.  Neck: Neck supple.  Cardiovascular: Normal rate, regular rhythm and normal heart sounds.   Pulmonary/Chest: Effort normal and breath sounds normal. No respiratory distress.  Abdominal: Soft. Bowel sounds are  normal. She exhibits no distension and no mass. There is tenderness.  Tenderness across the lower abdomen, no tenderness to the left lower quadrant.. Percussion to most of the abdomen is dull. No rebound or guarding.  Musculoskeletal: She exhibits no edema.  Neurological: She is alert and oriented to person, place, and time.  Skin: Skin is warm and dry.  Psychiatric: She has a normal mood and affect.  Nursing note and vitals reviewed.   Urgent Care Course     Procedures (including critical care time)  Labs  Review Labs Reviewed  POCT URINALYSIS DIP (DEVICE) - Abnormal; Notable for the following:       Result Value   Hgb urine dipstick TRACE (*)    Protein, ur 30 (*)    Leukocytes, UA TRACE (*)    All other components within normal limits    Imaging Review Dg Abd 2 Views  Result Date: 08/07/2016 CLINICAL DATA:  Abdominal pain, recent diarrhea, history diverticulitis EXAM: ABDOMEN - 2 VIEW COMPARISON:  CT abdomen 07/25/2015 FINDINGS: Moderate amount of stool throughout the colon. There is no bowel dilatation to suggest obstruction. There is no evidence of pneumoperitoneum, portal venous gas or pneumatosis. There are no pathologic calcifications along the expected course of the ureters. The osseous structures are unremarkable. IMPRESSION: Moderate amount of stool throughout the colon. Electronically Signed   By: Kathreen Devoid   On: 08/07/2016 11:39     Visual Acuity Review  Right Eye Distance:   Left Eye Distance:   Bilateral Distance:    Right Eye Near:   Left Eye Near:    Bilateral Near:         MDM   1. Chronic idiopathic constipation   2. Lower abdominal pain    The most likely reason for your abdominal pain is constipation. X-rays show that you have much stool in your colon. No tenderness to the left lower quadrant or fever to suggest an acute episode of diverticulitis at this time. You will need to start taking stool softeners 3 times a day and using MiraLAX. He may have to use MiraLAX up to 3 glasses within an hour and a half. If you are not having a good bowel movement in 6-8 hours and repeat another 3 glasses. Read instructions on high fiber diet. Follow-up with primary care doctor as needed. If he get worse, had new symptoms or problems, vomiting or see blood in your stool then you should go to emergency department.    Janne Napoleon, NP 08/07/16 1157

## 2016-11-06 ENCOUNTER — Encounter (HOSPITAL_COMMUNITY): Payer: Self-pay | Admitting: Emergency Medicine

## 2016-11-06 ENCOUNTER — Ambulatory Visit (INDEPENDENT_AMBULATORY_CARE_PROVIDER_SITE_OTHER): Payer: Self-pay

## 2016-11-06 ENCOUNTER — Ambulatory Visit (HOSPITAL_COMMUNITY)
Admission: EM | Admit: 2016-11-06 | Discharge: 2016-11-06 | Disposition: A | Discharge status: Home / Self Care | Payer: Self-pay | Attending: Family Medicine | Admitting: Family Medicine

## 2016-11-06 DIAGNOSIS — R102 Pelvic and perineal pain: Secondary | ICD-10-CM

## 2016-11-06 DIAGNOSIS — R300 Dysuria: Secondary | ICD-10-CM

## 2016-11-06 DIAGNOSIS — R109 Unspecified abdominal pain: Secondary | ICD-10-CM

## 2016-11-06 LAB — POCT URINALYSIS DIP (DEVICE)
GLUCOSE, UA: NEGATIVE mg/dL
KETONES UR: 15 mg/dL — AB
LEUKOCYTES UA: NEGATIVE
Nitrite: NEGATIVE
PROTEIN: 100 mg/dL — AB
Specific Gravity, Urine: 1.02 (ref 1.005–1.030)
UROBILINOGEN UA: 1 mg/dL (ref 0.0–1.0)
pH: 7 (ref 5.0–8.0)

## 2016-11-06 MED ORDER — PEG 3350-KCL-NA BICARB-NACL 420 G PO SOLR: 4000.0000 mL | Freq: Once | ORAL | 0 refills | Status: DC

## 2016-11-06 MED ORDER — PEG 3350-KCL-NA BICARB-NACL 420 G PO SOLR: 4000.0000 mL | Freq: Once | ORAL | 0 refills | Status: AC

## 2016-11-06 NOTE — Discharge Instructions (Signed)
I think this pain is due to constipation. I would recommend to doing an enema. If you do not have a significant bowel movement and I would try GoLYTELY I have prescribed this medication. If following the bowel movements you are still having significant pain for if your pain worsens I would return to the ED for follow-up. If you start vomiting continuously or develop fever I would return to the ED. If you do not improve over the next 24 hours following instructions above please return for reevaluation.

## 2016-11-06 NOTE — ED Triage Notes (Signed)
PT reports abdominal pain, pain with urination. PT reports she has taken several things to help her bowels move. PT reports gas as well. PT reports nausea and vomiting x1. No diarrhea. PT had a small BM this morning that hurt to pass.

## 2016-11-06 NOTE — ED Provider Notes (Signed)
[place]    CSN: [id number] Arrival date & time: 11/06/16  1143     History   Chief Complaint Chief Complaint  Patient presents with  . Abdominal Pain    HPI Olivia Gutierrez is a 61 y.o. female.   HPI   Patient presenting with abdominal pain.She states she has a history of diverticulosis and hiatal hernia. She states that she has had belching and pain with urination for the last 2 days. Along with significant abdominal pain, specifically in the pelvic area as well a rectal pain.  She denies any history of surgical procedures. She indicates that she does have significant history of constipation and her last bowel movement was 5 days ago. She has been seen in the past for constipation, MiraLAX however states that it does not work out well. She used to sennakot the past but feels like it causes too much cramping. She also indicates that she has had decreased intake over the past 2 days while having these symptoms. She states she is really hasn't been eating anything other than some crackers and water. Patient indicates she has been nauseate, vomited 1 when she tried to take Pepto-Bismol. She denies any fevers or chills. Patient does indicate having some dysuria associated with the abdominal pain. Denies any increased frequency or urgency.  Past Medical History:  Diagnosis Date  . Anemia   . Arthritis   . Asthma   . Diverticulosis   . GERD (gastroesophageal reflux disease)   . Hyperlipidemia   . Hypertension   . Morbid obesity [facility])     Patient Active Problem List   Diagnosis Date Noted  . SOB (shortness of breath) 09/29/2014  . Essential hypertension 09/29/2014  . Chronic anemia 09/29/2014  . Stricture and stenosis of esophagus 11/19/2013  . Special screening for malignant neoplasms, colon 10/20/2013  . Esophageal reflux 10/20/2013  . Dysphagia 10/20/2013    Past Surgical History:  Procedure Laterality Date  . none      OB History    No data  available       Home Medications    Prior to Admission medications   Medication Sig Start Date End Date Taking? Authorizing Provider  albuterol (PROVENTIL HFA;VENTOLIN HFA) 108 (90 BASE) MCG/ACT inhaler Inhale 1-2 puffs into the lungs every 6 (six) hours as needed for wheezing or shortness of breath.    [provider]  ibuprofen (ADVIL,MOTRIN) 200 MG tablet Take 400 mg by mouth every 6 (six) hours as needed for mild pain or moderate pain.    [provider]  polyethylene glycol-electrolytes (NULYTELY/GOLYTELY) 420 g solution Take 4,000 mLs by mouth once. 11/06/16 11/06/16  Vanessa Kick, MD    Family History Family History  Problem Relation Age of Onset  . Heart attack Mother   . Stroke Father   . Lung disease Father   . Heart failure Sister   . Heart attack Sister   . Colon cancer Brother   . Cystic fibrosis Sister        x 4    Social History Social History  Substance Use Topics  . Smoking status: Never Smoker  . Smokeless tobacco: Never Used  . Alcohol use No     Allergies   Other and Penicillins   Review of Systems Review of Systems  Constitutional: Positive for appetite change. Negative for chills and fever.  Respiratory: Negative for cough and shortness of breath.   Gastrointestinal: Positive for abdominal pain, constipation, nausea and vomiting. Negative  for abdominal distention and diarrhea.  Genitourinary: Positive for dysuria. Negative for flank pain, frequency and urgency.     Physical Exam Triage Vital Signs ED Triage Vitals  Enc Vitals Group     BP 11/06/16 1235 (!) 158/110     Pulse Rate 11/06/16 1235 79     Resp 11/06/16 1235 16     Temp 11/06/16 1235 98.3 F (36.8 C)     Temp Source 11/06/16 1235 Oral     SpO2 11/06/16 1235 96 %     Weight 11/06/16 1234 280 lb (127 kg)     Height 11/06/16 1234 5\' 7"  (1.702 m)     Head Circumference --      Peak Flow --      Pain Score 11/06/16 1234 8     Pain Loc --      Pain Edu? --        Excl. in [place]? --    No data found.   Updated Vital Signs BP (!) 158/110 (BP Location: Left Arm)   Pulse 79   Temp 98.3 F (36.8 C) (Oral)   Resp 16   Ht 5\' 7"  (1.702 m)   Wt 280 lb (127 kg)   SpO2 96%   BMI 43.85 kg/m    Physical Exam  Constitutional: She is oriented to person, place, and time. She appears well-developed and well-nourished.  HENT:  Head: Normocephalic and atraumatic.  Eyes: Conjunctivae are normal.  Neck: Normal range of motion. Neck supple.  Cardiovascular: Normal rate and regular rhythm.   Pulmonary/Chest: Effort normal and breath sounds normal.  Abdominal: Soft. Bowel sounds are normal. She exhibits no distension.  Positive for suprapubic tenderness Rectal exam without signs of impaction. No CVA tenderness  Musculoskeletal: Normal range of motion.  Neurological: She is alert and oriented to person, place, and time.  Skin: Skin is warm. Capillary refill takes less than 2 seconds.     UC Treatments / Results  Labs (all labs ordered are listed, but only abnormal results are displayed) Labs Reviewed  POCT URINALYSIS DIP (DEVICE) - Abnormal; Notable for the following:       Result Value   Bilirubin Urine SMALL (*)    Ketones, ur 15 (*)    Hgb urine dipstick TRACE (*)    Protein, ur 100 (*)    All other components within normal limits    EKG  EKG Interpretation None       Radiology Dg Abd Acute W/chest  Result Date: 11/06/2016 CLINICAL DATA:  Extreme gastritis. EXAM: DG ABDOMEN ACUTE W/ 1V CHEST COMPARISON:  Chest x-ray 09/29/2014.  Abdomen films 08/07/2016. FINDINGS: The lungs are clear without focal pneumonia, edema, pneumothorax or pleural effusion. Cardiopericardial silhouette is at upper limits of normal for size. The visualized bony structures of the thorax are intact. Upright film shows no evidence for intraperitoneal free air. There is no evidence for gaseous bowel dilation to suggest obstruction. Visualized bony anatomy  unremarkable. IMPRESSION: 1. No acute intracranial abnormality. 2. No evidence for bowel perforation or obstruction. Electronically Signed   By: Misty Stanley M.D.   On: 11/06/2016 13:37    Procedures Procedures (including critical care time)  Medications Ordered in UC Medications - No data to display   Initial Impression / Assessment and Plan / UC Course  I have reviewed the triage vital signs and the nursing notes.  Pertinent labs & imaging results that were available during my care of the patient were reviewed by me and  considered in my medical decision making (see chart for details). Patient presenting with 2 days of abdominal pain and acute abdominal series without signs of obstruction though with significant stool noted throughout. Patient did indicate some dysuria however her UA was without leukocytes or nitrites and trace hemoglobin. I believe that patient is a likely with significant stool, rectal exam without signs of impaction.Advised patient to perform an enema, if no improvement to use GoLYTELY to have a bowel cleanout. Provided red flags for return to the ED or to the clinic explained if there is no improvement with bowel movement over the next 24 hours to please return for follow-up  Clinical Course as of Nov 06 1441  Wed Nov 06, 2016  1320 Bilirubin Urine: (!) SMALL [AM]  1321 Ketones, ur: (!) 15 [AM]    Clinical Course User Index [AM] Tonette Bihari, MD     Final Clinical Impressions(s) / UC Diagnoses   Final diagnoses:  Suprapubic abdominal pain    New Prescriptions Discharge Medication List as of 11/06/2016  1:57 PM    START taking these medications   Details  polyethylene glycol-electrolytes (NULYTELY/GOLYTELY) 420 g solution Take 4,000 mLs by mouth once., Starting Wed 11/06/2016, Normal          Tonette Bihari, MD 11/06/16 1449

## 2017-03-21 ENCOUNTER — Other Ambulatory Visit: Payer: Self-pay

## 2017-03-21 ENCOUNTER — Ambulatory Visit (INDEPENDENT_AMBULATORY_CARE_PROVIDER_SITE_OTHER): Payer: Self-pay

## 2017-03-21 ENCOUNTER — Ambulatory Visit (HOSPITAL_COMMUNITY)
Admission: EM | Admit: 2017-03-21 | Discharge: 2017-03-21 | Disposition: A | Discharge status: Home / Self Care | Payer: Self-pay | Attending: Emergency Medicine | Admitting: Emergency Medicine

## 2017-03-21 ENCOUNTER — Encounter (HOSPITAL_COMMUNITY): Payer: Self-pay | Admitting: Emergency Medicine

## 2017-03-21 DIAGNOSIS — M545 Low back pain, unspecified: Secondary | ICD-10-CM

## 2017-03-21 MED ORDER — PREDNISONE 20 MG PO TABS: 40.0000 mg | ORAL_TABLET | Freq: Every day | ORAL | 0 refills | Status: AC

## 2017-03-21 MED ORDER — NAPROXEN 500 MG PO TABS: 500.0000 mg | ORAL_TABLET | Freq: Two times a day (BID) | ORAL | 0 refills | Status: AC

## 2017-03-21 MED ORDER — CYCLOBENZAPRINE HCL 10 MG PO TABS: 10.0000 mg | ORAL_TABLET | Freq: Two times a day (BID) | ORAL | 0 refills | Status: AC | PRN

## 2017-03-21 NOTE — Discharge Instructions (Signed)
Back Xray showing degenerative changes.  IMPRESSION: 1. Mild disc and advanced facet degeneration at L3-4 and L4-5. 2. No acute osseous abnormality.  Please take Naprosyn twice daily for pain. May take flexeril in the evening to help with sleep, will cause drowsiness.   Prednisone 40 mg for 3 days- this will help with inflammation.  Please follow up with Primary provider for continued pain, referral for physical therapy.
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
## 2017-03-21 NOTE — ED Triage Notes (Signed)
Pt states she started having lower back pain after shoveling snow last month.  She has been trying ice and heat and OTC NSAIDS with no relief.

## 2017-07-11 ENCOUNTER — Other Ambulatory Visit: Payer: Self-pay

## 2017-07-11 ENCOUNTER — Ambulatory Visit (HOSPITAL_COMMUNITY)
Admission: EM | Admit: 2017-07-11 | Discharge: 2017-07-11 | Disposition: A | Discharge status: Home / Self Care | Payer: BLUE CROSS/BLUE SHIELD | Attending: Family Medicine | Admitting: Family Medicine

## 2017-07-11 ENCOUNTER — Encounter (HOSPITAL_COMMUNITY): Payer: Self-pay | Admitting: Family Medicine

## 2017-07-11 DIAGNOSIS — R0602 Shortness of breath: Secondary | ICD-10-CM

## 2017-07-11 DIAGNOSIS — R002 Palpitations: Secondary | ICD-10-CM

## 2017-07-11 DIAGNOSIS — R140 Abdominal distension (gaseous): Secondary | ICD-10-CM

## 2017-07-11 MED ORDER — TRIAMTERENE-HCTZ 37.5-25 MG PO TABS: 1.0000 | ORAL_TABLET | Freq: Every day | ORAL | 1 refills | Status: DC

## 2017-07-11 MED ORDER — ALBUTEROL SULFATE HFA 108 (90 BASE) MCG/ACT IN AERS
INHALATION_SPRAY | RESPIRATORY_TRACT | Status: AC
  Filled 2017-07-11: qty 6.7

## 2017-07-11 MED ORDER — RANITIDINE HCL 300 MG PO TABS: 300.0000 mg | ORAL_TABLET | Freq: Every day | ORAL | 0 refills | Status: DC

## 2017-07-11 MED ORDER — ALBUTEROL SULFATE HFA 108 (90 BASE) MCG/ACT IN AERS
2.0000 | INHALATION_SPRAY | Freq: Four times a day (QID) | RESPIRATORY_TRACT | Status: DC | PRN
  Administered 2017-07-11: 2 via RESPIRATORY_TRACT

## 2017-07-11 NOTE — Discharge Instructions (Addendum)
Please return  if symptoms do not improve

## 2017-07-11 NOTE — ED Triage Notes (Addendum)
Pt here for heart palpitations, SOB and weakness all week. sts says SOB with exertion. Also some stomach cramping. She is under a lot of stress and she has a hx of asthma.

## 2017-07-11 NOTE — ED Provider Notes (Signed)
[place]   [id number] 07/11/17 Arrival Time: 1113   SUBJECTIVE:  Olivia Gutierrez is a 62 y.o. female who presents to the urgent care with complaint of heart palpitations, SOB and weakness all week. sts says SOB with exertion.   Also some lower abdominal cramping. She is under a lot of stress and she has a hx of asthma.   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  Past Medical History:  Diagnosis Date  . Anemia   . Arthritis   . Asthma   . Diverticulosis   . GERD (gastroesophageal reflux disease)   . Hyperlipidemia   . Hypertension   . Morbid obesity ([place])    Family History  Problem Relation Age of Onset  . Heart attack Mother   . Stroke Father   . Lung disease Father   . Heart failure Sister   . Heart attack Sister   . Colon cancer Brother   . Cystic fibrosis Sister        x 4   Social History   Socioeconomic History  . Marital status: Married    Spouse name: Not on file  . Number of children: 0  . Years of education: Not on file  . Highest education level: Not on file  Occupational History  . Occupation: Wellsite geologist  Social Needs  . Financial resource strain: Not on file  . Food insecurity:    Worry: Not on file    Inability: Not on file  . Transportation needs:    Medical: Not on file    Non-medical: Not on file  Tobacco Use  . Smoking status: Never Smoker  . Smokeless tobacco: Never Used  Substance and Sexual Activity  . Alcohol use: No  . Drug use: No  . Sexual activity: Yes    Birth control/protection: Post-menopausal  Lifestyle  . Physical activity:    Days per week: Not on file    Minutes per session: Not on file  . Stress: Not on file  Relationships  . Social connections:    Talks on phone: Not on file    Gets together: Not on file    Attends religious service: Not on file    Active member of club or organization: Not on file    Attends meetings of clubs or organizations: Not on file    Relationship status:  Not on file  . Intimate partner violence:    Fear of current or ex partner: Not on file    Emotionally abused: Not on file    Physically abused: Not on file    Forced sexual activity: Not on file  Other Topics Concern  . Not on file  Social History Narrative  . Not on file   No outpatient medications have been marked as taking for the 07/11/17 encounter [facility] Encounter).   Allergies  Allergen Reactions  . Other Shortness Of Breath and Itching    Certain peanuts.. Pecans, walnuts  . Penicillins Hives and Rash    Has patient had a PCN reaction causing immediate rash, facial/tongue/throat swelling, SOB or lightheadedness with hypotension:unknown Has patient had a PCN reaction causing severe rash involving mucus membranes or skin necrosis: unknown Has patient had a PCN reaction that required hospitalization : yes Has patient had a PCN reaction occurring within the last 10 years: no, childhood allergy If all of the above answers are "NO", then may proceed with Cephalosporin use.       ROS: As per HPI,  remainder of ROS negative.   OBJECTIVE:   Vitals:   07/11/17 1131 07/11/17 1135  BP:  (!) 141/92  Pulse: 62   Resp: (!) 22   Temp: 98.5 F (36.9 C)   SpO2: 98%      General appearance: alert; no distress Eyes: PERRL; EOMI; conjunctiva normal HENT: normocephalic; atraumatic;oral mucosa normal Neck: supple Lungs: clear to auscultation bilaterally Heart: regular rate and rhythm Abdomen: soft, mild lower abdominal tenderness; bowel sounds normal; no masses or organomegaly; no guarding or rebound tenderness Back: no CVA tenderness Extremities: no cyanosis or edema; symmetrical with no gross deformities Skin: warm and dry Neurologic: normal gait; grossly normal Psychological: alert and cooperative; normal mood and affect      Labs:  Results for orders placed or performed during the hospital encounter of 11/06/16  POCT urinalysis dip (device)  Result Value Ref  Range   Glucose, UA NEGATIVE NEGATIVE mg/dL   Bilirubin Urine SMALL (A) NEGATIVE   Ketones, ur 15 (A) NEGATIVE mg/dL   Specific Gravity, Urine 1.020 1.005 - 1.030   Hgb urine dipstick TRACE (A) NEGATIVE   pH 7.0 5.0 - 8.0   Protein, ur 100 (A) NEGATIVE mg/dL   Urobilinogen, UA 1.0 0.0 - 1.0 mg/dL   Nitrite NEGATIVE NEGATIVE   Leukocytes, UA NEGATIVE NEGATIVE    Labs Reviewed - No data to display  No results found.  EKG shows no significant change from that of 2016:  Some inverted lateral T waves.   ASSESSMENT & PLAN:  1. Heart palpitations   2. Shortness of breath   3. Bloating     Meds ordered this encounter  Medications  . albuterol (PROVENTIL HFA;VENTOLIN HFA) 108 (90 Base) MCG/ACT inhaler 2 puff  . triamterene-hydrochlorothiazide (MAXZIDE-25) 37.5-25 MG tablet    Sig: Take 1 tablet by mouth daily.    Dispense:  90 tablet    Refill:  1  . ranitidine (ZANTAC) 300 MG tablet    Sig: Take 1 tablet (300 mg total) by mouth at bedtime.    Dispense:  30 tablet    Refill:  0    Reviewed expectations re: course of current medical issues. Questions answered. Outlined signs and symptoms indicating need for more acute intervention. Patient verbalized understanding. After Visit Summary given.    Procedures:      Robyn Haber, MD 07/11/17 1155

## 2017-10-22 ENCOUNTER — Ambulatory Visit (INDEPENDENT_AMBULATORY_CARE_PROVIDER_SITE_OTHER): Payer: Self-pay

## 2017-10-22 ENCOUNTER — Ambulatory Visit (INDEPENDENT_AMBULATORY_CARE_PROVIDER_SITE_OTHER): Payer: BLUE CROSS/BLUE SHIELD | Admitting: Surgery

## 2017-10-22 ENCOUNTER — Encounter (INDEPENDENT_AMBULATORY_CARE_PROVIDER_SITE_OTHER): Payer: Self-pay | Admitting: Surgery

## 2017-10-22 DIAGNOSIS — M19021 Primary osteoarthritis, right elbow: Secondary | ICD-10-CM

## 2017-10-22 DIAGNOSIS — M25521 Pain in right elbow: Secondary | ICD-10-CM

## 2017-10-22 DIAGNOSIS — M4726 Other spondylosis with radiculopathy, lumbar region: Secondary | ICD-10-CM

## 2017-10-22 DIAGNOSIS — M25562 Pain in left knee: Principal | ICD-10-CM

## 2017-10-22 DIAGNOSIS — M7711 Lateral epicondylitis, right elbow: Secondary | ICD-10-CM

## 2017-10-22 DIAGNOSIS — G8929 Other chronic pain: Secondary | ICD-10-CM

## 2017-10-22 NOTE — Progress Notes (Signed)
Office Visit Note   Patient: Olivia Gutierrez           Date of Birth: [birth year]           MRN: [id number] Visit Date: 10/22/2017              Requested by: Benito Mccreedy, MD [id number] [place], [place] [address] PCP: Benito Mccreedy, MD   Assessment & Plan: Visit Diagnoses:  1. Chronic pain of left knee   2. Pain in right elbow   3. Other spondylosis with radiculopathy, lumbar region   4. Arthritis of right elbow   5. Lateral epicondylitis, right elbow     Plan: In hopes to given some relief to all areas of pain addressed today I elected to give patient a Depomedrol 80 mg and Toradol 30 mg IM injections.  Follow-up with Dr. Erlinda Hong for left knee pain.  Discussed with her that ultimately is going to come down to needing definitive treatment with total knee replacement but she states that she is not wanting to have that done anytime in the near future.  We also discussed conservative management with intra-articular Marcaine/Depo-Medrol injection versus Visco supplementation.  She is willing to entertain that and Dr. Erlinda Hong can discuss that further.  Regards to lumbar spine issues this has progressively worsened over the last several months.  She has failed conservative treatment with muscle relaxers and oral prednisone.  We will get lumbar MRI to rule out HNP/stenosis.  Follow-up with Dr. Louanne Skye after completion of that study to discuss results and further treatment options.  For right lateral elbow pain I did instruct some stretching exercises.  Follow-Up Instructions: No follow-ups on file.   Orders:  Orders Placed This Encounter  Procedures  . XR Knee 1-2 Views Left  . XR Elbow 2 Views Right  . MR Lumbar Spine w/o contrast   No orders of the defined types were placed in this encounter.     Procedures: No procedures performed   Clinical Data: No additional findings.   Subjective: Chief Complaint  Patient presents with  . Left Knee - Pain  . Lower  Back - Pain  . Right Elbow - Pain    HPI  Review of Systems   Objective: Vital Signs: There were no vitals taken for this visit.  Physical Exam  Ortho Exam  Specialty Comments:  No specialty comments available.  Imaging: No results found.   PMFS History: Patient Active Problem List   Diagnosis Date Noted  . SOB (shortness of breath) 09/29/2014  . Essential hypertension 09/29/2014  . Chronic anemia 09/29/2014  . Stricture and stenosis of esophagus 11/19/2013  . Special screening for malignant neoplasms, colon 10/20/2013  . Esophageal reflux 10/20/2013  . Dysphagia 10/20/2013   Past Medical History:  Diagnosis Date  . Anemia   . Arthritis   . Asthma   . Diverticulosis   . GERD (gastroesophageal reflux disease)   . Hyperlipidemia   . Hypertension   . Morbid obesity ([place])     Family History  Problem Relation Age of Onset  . Heart attack Mother   . Stroke Father   . Lung disease Father   . Heart failure Sister   . Heart attack Sister   . Colon cancer Brother   . Cystic fibrosis Sister        x 4    Past Surgical History:  Procedure Laterality Date  . none     Social History  Occupational History  . Occupation: Wellsite geologist  Tobacco Use  . Smoking status: Never Smoker  . Smokeless tobacco: Never Used  Substance and Sexual Activity  . Alcohol use: No  . Drug use: No  . Sexual activity: Yes    Birth control/protection: Post-menopausal

## 2017-11-04 ENCOUNTER — Ambulatory Visit (INDEPENDENT_AMBULATORY_CARE_PROVIDER_SITE_OTHER): Payer: BLUE CROSS/BLUE SHIELD | Admitting: Orthopaedic Surgery

## 2017-11-24 ENCOUNTER — Ambulatory Visit (INDEPENDENT_AMBULATORY_CARE_PROVIDER_SITE_OTHER): Payer: BLUE CROSS/BLUE SHIELD | Admitting: Specialist

## 2018-07-14 ENCOUNTER — Encounter (HOSPITAL_COMMUNITY): Payer: Self-pay | Admitting: Emergency Medicine

## 2018-07-14 ENCOUNTER — Emergency Department (HOSPITAL_COMMUNITY)
Admission: EM | Admit: 2018-07-14 | Discharge: 2018-07-14 | Disposition: A | Discharge status: Home / Self Care | Payer: HRSA Program | Attending: Emergency Medicine | Admitting: Emergency Medicine

## 2018-07-14 ENCOUNTER — Other Ambulatory Visit: Payer: Self-pay

## 2018-07-14 DIAGNOSIS — Z79899 Other long term (current) drug therapy: Secondary | ICD-10-CM | POA: Diagnosis not present

## 2018-07-14 DIAGNOSIS — Z20828 Contact with and (suspected) exposure to other viral communicable diseases: Secondary | ICD-10-CM | POA: Insufficient documentation

## 2018-07-14 DIAGNOSIS — Z139 Encounter for screening, unspecified: Secondary | ICD-10-CM

## 2018-07-14 DIAGNOSIS — Z8709 Personal history of other diseases of the respiratory system: Secondary | ICD-10-CM | POA: Diagnosis not present

## 2018-07-14 DIAGNOSIS — I10 Essential (primary) hypertension: Secondary | ICD-10-CM | POA: Diagnosis not present

## 2018-07-14 NOTE — ED Triage Notes (Signed)
Pt. Stated, I have NO symptoms and I was in quarantined for 14 days.

## 2018-07-14 NOTE — Discharge Instructions (Signed)
Please follow up with your PCP regarding COVID 19 testing

## 2018-07-14 NOTE — ED Triage Notes (Signed)
Pt. Stated, my husband has the virus and has a lot of underlying conditions.  But its imperative that I get tested.

## 2018-07-14 NOTE — ED Provider Notes (Signed)
[place] EMERGENCY DEPARTMENT Provider Note   CSN: [id number] Arrival date & time: 07/14/18  1154    History   Chief Complaint No chief complaint on file.   HPI Aralyn Nowak is a 63 y.o. female with PMHx HTN, HLD, GERD, and Asthma who presents to the ED requesting COVID 19 testing. Pt reports her husband tested positive about 1 week ago and is current at [facility] for treatment. She states she was told by her PCP that it is "imperative" that she be tested prior to her husband coming home. Pt has been self quarantining for the past 14 days after other family members that she was around tested positive. Pt has no symptoms - she denies fever, chills, cough, shortness of breath, GI symptoms, or any other symptoms at this time.        Past Medical History:  Diagnosis Date  . Anemia   . Arthritis   . Asthma   . Diverticulosis   . GERD (gastroesophageal reflux disease)   . Hyperlipidemia   . Hypertension   . Morbid obesity [facility])     Patient Active Problem List   Diagnosis Date Noted  . SOB (shortness of breath) 09/29/2014  . Essential hypertension 09/29/2014  . Chronic anemia 09/29/2014  . Stricture and stenosis of esophagus 11/19/2013  . Special screening for malignant neoplasms, colon 10/20/2013  . Esophageal reflux 10/20/2013  . Dysphagia 10/20/2013    Past Surgical History:  Procedure Laterality Date  . none       OB History   No obstetric history on file.      Home Medications    Prior to Admission medications   Medication Sig Start Date End Date Taking? Authorizing Provider  albuterol (PROVENTIL HFA;VENTOLIN HFA) 108 (90 BASE) MCG/ACT inhaler Inhale 1-2 puffs into the lungs every 6 (six) hours as needed for wheezing or shortness of breath.    [provider]  ibuprofen (ADVIL,MOTRIN) 200 MG tablet Take 400 mg by mouth every 6 (six) hours as needed for mild pain or moderate pain.    [provider]   ranitidine (ZANTAC) 300 MG tablet Take 1 tablet (300 mg total) by mouth at bedtime. 07/11/17   Robyn Haber, MD  triamterene-hydrochlorothiazide (MAXZIDE-25) 37.5-25 MG tablet Take 1 tablet by mouth daily. 07/11/17   Robyn Haber, MD    Family History Family History  Problem Relation Age of Onset  . Heart attack Mother   . Stroke Father   . Lung disease Father   . Heart failure Sister   . Heart attack Sister   . Colon cancer Brother   . Cystic fibrosis Sister        x 4    Social History Social History   Tobacco Use  . Smoking status: Never Smoker  . Smokeless tobacco: Never Used  Substance Use Topics  . Alcohol use: No  . Drug use: No     Allergies   Other and Penicillins   Review of Systems Review of Systems  Constitutional: Negative for chills and fever.  Respiratory: Negative for cough and shortness of breath.   Cardiovascular: Negative for chest pain.  Gastrointestinal: Negative for abdominal pain, diarrhea, nausea and vomiting.     Physical Exam Updated Vital Signs BP (!) 143/90 (BP Location: Right Arm)   Pulse 66   Temp 98.4 F (36.9 C) (Oral)   Resp 18   Ht 5\' 7"  (1.702 m)   SpO2 100%   BMI 43.85  kg/m   Physical Exam Vitals signs and nursing note reviewed.  Constitutional:      Appearance: She is not ill-appearing.     Comments: Speaking in full sentences; no increased work of breathing  HENT:     Head: Normocephalic and atraumatic.  Eyes:     Conjunctiva/sclera: Conjunctivae normal.  Cardiovascular:     Rate and Rhythm: Normal rate and regular rhythm.     Pulses: Normal pulses.     Heart sounds: No murmur.  Pulmonary:     Effort: Pulmonary effort is normal. No respiratory distress.     Breath sounds: Normal breath sounds. No wheezing, rhonchi or rales.  Skin:    General: Skin is warm and dry.     Coloration: Skin is not jaundiced.  Neurological:     Mental Status: She is alert.      ED Treatments / Results  Labs (all  labs ordered are listed, but only abnormal results are displayed) Labs Reviewed - No data to display  EKG None  Radiology No results found.  Procedures Procedures (including critical care time)  Medications Ordered in ED Medications - No data to display   Initial Impression / Assessment and Plan / ED Course  I have reviewed the triage vital signs and the nursing notes.  Pertinent labs & imaging results that were available during my care of the patient were reviewed by me and considered in my medical decision making (see chart for details).    Pt is a 63 year old female who presents to the ED for covid 19 testing after her husband tested positive. She has been quarantining for the past 14 days; she has had no symptoms. Vital signs stable; afebrile in the ED; satting 100% on RA. No increased work of breathing. Speaking in full sentences. Does not appear to be in distress. Had lengthy discussion with patient regarding the fact that we are not testing individuals without symptoms currently; we are saving testing for patients who need to be admitted. Advised that she follow up with her PCP regarding testing. I do not feel that patient needs testing at this time given completely asymptomatic at this time. Return precautions discussed with patient. Stable for discharge home.   Mardene Lessig was evaluated in Emergency Department on 07/14/2018 for the symptoms described in the history of present illness. She was evaluated in the context of the global COVID-19 pandemic, which necessitated consideration that the patient might be at risk for infection with the SARS-CoV-2 virus that causes COVID-19. Institutional protocols and algorithms that pertain to the evaluation of patients at risk for COVID-19 are in a state of rapid change based on information released by regulatory bodies including the CDC and federal and state organizations. These policies and algorithms were followed during the  patient's care in the ED.       Final Clinical Impressions(s) / ED Diagnoses   Final diagnoses:  Encounter for screening    ED Discharge Orders    None       Eustaquio Maize, PA-C 07/14/18 1527    Maudie Flakes, MD 07/19/18 [phone]

## 2018-09-16 ENCOUNTER — Other Ambulatory Visit: Payer: Self-pay | Admitting: *Deleted

## 2018-09-16 DIAGNOSIS — Z20822 Contact with and (suspected) exposure to covid-19: Secondary | ICD-10-CM

## 2018-09-16 NOTE — Progress Notes (Signed)
L 

## 2018-09-17 LAB — NOVEL CORONAVIRUS, NAA: SARS-CoV-2, NAA: NOT DETECTED

## 2019-01-04 ENCOUNTER — Ambulatory Visit (HOSPITAL_COMMUNITY)
Admission: EM | Admit: 2019-01-04 | Discharge: 2019-01-04 | Disposition: A | Discharge status: Home / Self Care | Payer: Self-pay | Attending: Family Medicine | Admitting: Family Medicine

## 2019-01-04 ENCOUNTER — Encounter (HOSPITAL_COMMUNITY): Payer: Self-pay

## 2019-01-04 ENCOUNTER — Other Ambulatory Visit: Payer: Self-pay

## 2019-01-04 DIAGNOSIS — N390 Urinary tract infection, site not specified: Secondary | ICD-10-CM

## 2019-01-04 DIAGNOSIS — I10 Essential (primary) hypertension: Secondary | ICD-10-CM

## 2019-01-04 DIAGNOSIS — R1030 Lower abdominal pain, unspecified: Secondary | ICD-10-CM

## 2019-01-04 DIAGNOSIS — K5900 Constipation, unspecified: Secondary | ICD-10-CM

## 2019-01-04 LAB — POCT URINALYSIS DIP (DEVICE)
Bilirubin Urine: NEGATIVE
Glucose, UA: NEGATIVE mg/dL
Ketones, ur: NEGATIVE mg/dL
Nitrite: NEGATIVE
Protein, ur: NEGATIVE mg/dL
Specific Gravity, Urine: 1.025 (ref 1.005–1.030)
Urobilinogen, UA: 0.2 mg/dL (ref 0.0–1.0)
pH: 6.5 (ref 5.0–8.0)

## 2019-01-04 MED ORDER — NITROFURANTOIN MONOHYD MACRO 100 MG PO CAPS: 100.0000 mg | ORAL_CAPSULE | Freq: Two times a day (BID) | ORAL | 0 refills | Status: DC

## 2019-01-04 MED ORDER — KETOROLAC TROMETHAMINE 30 MG/ML IJ SOLN
30.0000 mg | Freq: Once | INTRAMUSCULAR | Status: AC
  Administered 2019-01-04: 12:00:00 30 mg via INTRAMUSCULAR

## 2019-01-04 MED ORDER — KETOROLAC TROMETHAMINE 30 MG/ML IJ SOLN
INTRAMUSCULAR | Status: AC
  Filled 2019-01-04: qty 1

## 2019-01-04 NOTE — ED Provider Notes (Signed)
[facility]    CSN: [id number] Arrival date & time: 01/04/19  1015      History   Chief Complaint Chief Complaint  Patient presents with  . Abdominal Pain    HPI Olivia Gutierrez is a 63 y.o. female.   Pt is a 63 year old female past medical history of anemia, arthritis, asthma, diverticulosis, GERD, hyperlipidemia, hypertension.  She presents today complaining of pain and pressure in the lower abdomen/rectum area since approximate 3 days ago.  Pain is worse when she has a bowel movement.  She has had very small bowel movements.  Denies any rectal bleeding.  Symptoms been constant, waxing and waning.  She has not taken anything for her symptoms.  No associated fevers, chills, body aches, dysuria.  She has had some urinary frequency.  She is not currently sexually active and denies any vaginal discharge or bleeding.   ROS per HPI      Past Medical History:  Diagnosis Date  . Anemia   . Arthritis   . Asthma   . Diverticulosis   . GERD (gastroesophageal reflux disease)   . Hyperlipidemia   . Hypertension   . Morbid obesity [facility])     Patient Active Problem List   Diagnosis Date Noted  . SOB (shortness of breath) 09/29/2014  . Essential hypertension 09/29/2014  . Chronic anemia 09/29/2014  . Stricture and stenosis of esophagus 11/19/2013  . Special screening for malignant neoplasms, colon 10/20/2013  . Esophageal reflux 10/20/2013  . Dysphagia 10/20/2013    Past Surgical History:  Procedure Laterality Date  . none      OB History   No obstetric history on file.      Home Medications    Prior to Admission medications   Medication Sig Start Date End Date Taking? Authorizing Provider  albuterol (PROVENTIL HFA;VENTOLIN HFA) 108 (90 BASE) MCG/ACT inhaler Inhale 1-2 puffs into the lungs every 6 (six) hours as needed for wheezing or shortness of breath.    [provider]  ibuprofen (ADVIL,MOTRIN) 200 MG tablet Take 400 mg by mouth  every 6 (six) hours as needed for mild pain or moderate pain.    [provider]  nitrofurantoin, macrocrystal-monohydrate, (MACROBID) 100 MG capsule Take 1 capsule (100 mg total) by mouth 2 (two) times daily. 01/04/19   Loura Halt A, NP  ranitidine (ZANTAC) 300 MG tablet Take 1 tablet (300 mg total) by mouth at bedtime. 07/11/17   Robyn Haber, MD  triamterene-hydrochlorothiazide (MAXZIDE-25) 37.5-25 MG tablet Take 1 tablet by mouth daily. 07/11/17   Robyn Haber, MD    Family History Family History  Problem Relation Age of Onset  . Heart attack Mother   . Stroke Father   . Lung disease Father   . Heart failure Sister   . Heart attack Sister   . Colon cancer Brother   . Cystic fibrosis Sister        x 4    Social History Social History   Tobacco Use  . Smoking status: Never Smoker  . Smokeless tobacco: Never Used  Substance Use Topics  . Alcohol use: No  . Drug use: No     Allergies   Other and Penicillins   Review of Systems Review of Systems   Physical Exam Triage Vital Signs ED Triage Vitals  Enc Vitals Group     BP 01/04/19 1110 (!) 160/90     Pulse Rate 01/04/19 1110 62     Resp 01/04/19 1110 18  Temp 01/04/19 1110 98.5 F (36.9 C)     Temp Source 01/04/19 1110 Oral     SpO2 01/04/19 1110 98 %     Weight --      Height --      Head Circumference --      Peak Flow --      Pain Score 01/04/19 1108 7     Pain Loc --      Pain Edu? --      Excl. in [place]? --    No data found.  Updated Vital Signs BP (!) 160/90 (BP Location: Right Arm)   Pulse 62   Temp 98.5 F (36.9 C) (Oral)   Resp 18   SpO2 98%   Visual Acuity Right Eye Distance:   Left Eye Distance:   Bilateral Distance:    Right Eye Near:   Left Eye Near:    Bilateral Near:     Physical Exam Vitals signs and nursing note reviewed.  Constitutional:      General: She is not in acute distress.    Appearance: She is well-developed. She is not ill-appearing,  toxic-appearing or diaphoretic.  HENT:     Head: Normocephalic and atraumatic.  Pulmonary:     Effort: Pulmonary effort is normal.  Abdominal:     General: Bowel sounds are normal.     Palpations: Abdomen is soft. There is no hepatomegaly or splenomegaly.     Tenderness: There is abdominal tenderness in the right lower quadrant, suprapubic area and left lower quadrant. There is no right CVA tenderness, left CVA tenderness, guarding or rebound.     Hernia: No hernia is present.  Genitourinary:    Rectum: Normal. No mass or tenderness.  Skin:    General: Skin is warm and dry.  Neurological:     Mental Status: She is alert.  Psychiatric:        Mood and Affect: Mood normal.      UC Treatments / Results  Labs (all labs ordered are listed, but only abnormal results are displayed) Labs Reviewed  POCT URINALYSIS DIP (DEVICE) - Abnormal; Notable for the following components:      Result Value   Hgb urine dipstick MODERATE (*)    Leukocytes,Ua TRACE (*)    All other components within normal limits  URINE CULTURE    EKG   Radiology No results found.  Procedures Procedures (including critical care time)  Medications Ordered in UC Medications  ketorolac (TORADOL) 30 MG/ML injection 30 mg (has no administration in time range)    Initial Impression / Assessment and Plan / UC Course  I have reviewed the triage vital signs and the nursing notes.  Pertinent labs & imaging results that were available during my care of the patient were reviewed by me and considered in my medical decision making (see chart for details).     Lower abdominal discomfort- urine with trace leuks and hgb. Will send for culture and treat pending results.  Pt has been constipated and straining to have BM. Symptoms could be related to constipation Will have her start miralax.  No hemorrhoids on exam. Non tender to rectal exam. No bleeding.  Toradol given for pain.  Pt not sexually active or having any  vaginal symptoms. Not likely STD, BV, yeast.  Will have her try what we recommended and follow up as needed.  Final Clinical Impressions(s) / UC Diagnoses   Final diagnoses:  Lower abdominal pain     Discharge Instructions  We believe your symptoms to be due to constipation or a possible urinary tract infection Based on your urine we will go ahead and treat you for urinary tract infection and send the urine for culture We will have you start taking MiraLAX 1 scoop twice a day until you have a good bowel movement and then you can back off to once a day or once every couple days. Make sure you are drinking plenty of fluids to include 8 glasses of water daily Foods high in fiber to include fruits and veggies. The mass you felt could have been an internal hemorrhoid or some impacted stool from constipation If your symptoms worsen or do not resolve you need to follow-up with your primary care for further instruction. We gave you Toradol here for your pain you can take ibuprofen or Tylenol at home as needed   ED Prescriptions    Medication Sig Dispense Auth. Provider   nitrofurantoin, macrocrystal-monohydrate, (MACROBID) 100 MG capsule Take 1 capsule (100 mg total) by mouth 2 (two) times daily. 10 capsule Loura Halt A, NP     PDMP not reviewed this encounter.   Orvan July, NP 01/04/19 2133

## 2019-01-04 NOTE — ED Triage Notes (Signed)
Patient presents to Urgent Care with complaints of pain and pressure in her lower abdomen/ rectum aread since the past 3 days . Patient reports her pain is worse when she uses the bathroom, has had to call out of work the past 2 days.

## 2019-01-04 NOTE — Discharge Instructions (Addendum)
We believe your symptoms to be due to constipation or a possible urinary tract infection Based on your urine we will go ahead and treat you for urinary tract infection and send the urine for culture We will have you start taking MiraLAX 1 scoop twice a day until you have a good bowel movement and then you can back off to once a day or once every couple days. Make sure you are drinking plenty of fluids to include 8 glasses of water daily Foods high in fiber to include fruits and veggies. The mass you felt could have been an internal hemorrhoid or some impacted stool from constipation If your symptoms worsen or do not resolve you need to follow-up with your primary care for further instruction. We gave you Toradol here for your pain you can take ibuprofen or Tylenol at home as needed

## 2019-01-05 LAB — URINE CULTURE

## 2019-05-20 ENCOUNTER — Other Ambulatory Visit: Payer: Self-pay

## 2019-05-20 ENCOUNTER — Ambulatory Visit: Payer: Self-pay | Admitting: Family Medicine

## 2019-05-20 VITALS — BP 139/87 | HR 69 | Temp 98.2°F | Resp 18 | Ht 67.0 in

## 2019-05-20 DIAGNOSIS — Z114 Encounter for screening for human immunodeficiency virus [HIV]: Secondary | ICD-10-CM

## 2019-05-20 DIAGNOSIS — Z1321 Encounter for screening for nutritional disorder: Secondary | ICD-10-CM

## 2019-05-20 DIAGNOSIS — Z131 Encounter for screening for diabetes mellitus: Secondary | ICD-10-CM

## 2019-05-20 DIAGNOSIS — Z1329 Encounter for screening for other suspected endocrine disorder: Secondary | ICD-10-CM

## 2019-05-20 DIAGNOSIS — M5431 Sciatica, right side: Secondary | ICD-10-CM

## 2019-05-20 DIAGNOSIS — I10 Essential (primary) hypertension: Secondary | ICD-10-CM

## 2019-05-20 DIAGNOSIS — Z9189 Other specified personal risk factors, not elsewhere classified: Secondary | ICD-10-CM

## 2019-05-20 DIAGNOSIS — Z1231 Encounter for screening mammogram for malignant neoplasm of breast: Secondary | ICD-10-CM

## 2019-05-20 DIAGNOSIS — J4520 Mild intermittent asthma, uncomplicated: Secondary | ICD-10-CM

## 2019-05-20 DIAGNOSIS — Z1159 Encounter for screening for other viral diseases: Secondary | ICD-10-CM

## 2019-05-20 DIAGNOSIS — Z139 Encounter for screening, unspecified: Secondary | ICD-10-CM

## 2019-05-20 LAB — POCT GLYCOSYLATED HEMOGLOBIN (HGB A1C): Hemoglobin A1C: 5.3 % (ref 4.0–5.6)

## 2019-05-20 MED ORDER — ALBUTEROL SULFATE HFA 108 (90 BASE) MCG/ACT IN AERS: 2.0000 | INHALATION_SPRAY | Freq: Four times a day (QID) | RESPIRATORY_TRACT | Status: DC | PRN

## 2019-05-20 MED ORDER — KETOROLAC TROMETHAMINE 30 MG/ML IJ SOLN
30.0000 mg | Freq: Once | INTRAMUSCULAR | Status: AC
  Administered 2019-05-20: 30 mg via INTRAMUSCULAR

## 2019-05-20 MED ORDER — PREDNISONE 20 MG PO TABS: ORAL_TABLET | ORAL | 0 refills | Status: DC

## 2019-05-20 MED ORDER — TRIAMTERENE-HCTZ 37.5-25 MG PO TABS: 1.0000 | ORAL_TABLET | Freq: Every day | ORAL | 1 refills | Status: DC

## 2019-05-20 MED ORDER — CYCLOBENZAPRINE HCL 10 MG PO TABS
10.0000 mg | ORAL_TABLET | Freq: Two times a day (BID) | ORAL | 0 refills | Status: DC | PRN
Start: 2019-05-20 — End: 2019-11-09

## 2019-05-20 NOTE — Patient Instructions (Addendum)
To schedule your breast exam, please call [facility] at   Phone: [phone]. They have scholarships available for free screenings if you qualify.   Please activate MyChart in order to receive your lab result and communicate with provider.  Your diabetes screening today was normal. Great news!  I have included information on health promotion and back pain.    Encouraged efforts to reduce weight include engaging in physical activity as tolerated with goal of 150 minutes per week. Improve dietary choices and eat a meal regimen consistent with a Mediterranean or DASH diet. Reduce simple carbohydrates. Do not skip meals and eat healthy snacks throughout the day to avoid over-eating at dinner. Set a goal weight loss that is achievable for you.        Preventive Care 7-42 Years Old, Female Preventive care refers to visits with your health care provider and lifestyle choices that can promote health and wellness. This includes:  A yearly physical exam. This may also be called an annual well check.  Regular dental visits and eye exams.  Immunizations.  Screening for certain conditions.  Healthy lifestyle choices, such as eating a healthy diet, getting regular exercise, not using drugs or products that contain nicotine and tobacco, and limiting alcohol use. What can I expect for my preventive care visit? Physical exam Your health care provider will check your:  Height and weight. This may be used to calculate body mass index (BMI), which tells if you are at a healthy weight.  Heart rate and blood pressure.  Skin for abnormal spots. Counseling Your health care provider may ask you questions about your:  Alcohol, tobacco, and drug use.  Emotional well-being.  Home and relationship well-being.  Sexual activity.  Eating habits.  Work and work Statistician.  Method of birth control.  Menstrual cycle.  Pregnancy history. What immunizations do I need?  Influenza  (flu) vaccine  This is recommended every year. Tetanus, diphtheria, and pertussis (Tdap) vaccine  You may need a Td booster every 10 years. Varicella (chickenpox) vaccine  You may need this if you have not been vaccinated. Zoster (shingles) vaccine  You may need this after age 30. Measles, mumps, and rubella (MMR) vaccine  You may need at least one dose of MMR if you were born in 1957 or later. You may also need a second dose. Pneumococcal conjugate (PCV13) vaccine  You may need this if you have certain conditions and were not previously vaccinated. Pneumococcal polysaccharide (PPSV23) vaccine  You may need one or two doses if you smoke cigarettes or if you have certain conditions. Meningococcal conjugate (MenACWY) vaccine  You may need this if you have certain conditions. Hepatitis A vaccine  You may need this if you have certain conditions or if you travel or work in places where you may be exposed to hepatitis A. Hepatitis B vaccine  You may need this if you have certain conditions or if you travel or work in places where you may be exposed to hepatitis B. Haemophilus influenzae type b (Hib) vaccine  You may need this if you have certain conditions. Human papillomavirus (HPV) vaccine  If recommended by your health care provider, you may need three doses over 6 months. You may receive vaccines as individual doses or as more than one vaccine together in one shot (combination vaccines). Talk with your health care provider about the risks and benefits of combination vaccines. What tests do I need? Blood tests  Lipid and cholesterol levels. These may  be checked every 5 years, or more frequently if you are over 73 years old.  Hepatitis C test.  Hepatitis B test. Screening  Lung cancer screening. You may have this screening every year starting at age 47 if you have a 30-pack-year history of smoking and currently smoke or have quit within the past 15 years.  Colorectal  cancer screening. All adults should have this screening starting at age 67 and continuing until age 39. Your health care provider may recommend screening at age 48 if you are at increased risk. You will have tests every 1-10 years, depending on your results and the type of screening test.  Diabetes screening. This is done by checking your blood sugar (glucose) after you have not eaten for a while (fasting). You may have this done every 1-3 years.  Mammogram. This may be done every 1-2 years. Talk with your health care provider about when you should start having regular mammograms. This may depend on whether you have a family history of breast cancer.  BRCA-related cancer screening. This may be done if you have a family history of breast, ovarian, tubal, or peritoneal cancers.  Pelvic exam and Pap test. This may be done every 3 years starting at age 77. Starting at age 39, this may be done every 5 years if you have a Pap test in combination with an HPV test. Other tests  Sexually transmitted disease (STD) testing.  Bone density scan. This is done to screen for osteoporosis. You may have this scan if you are at high risk for osteoporosis. Follow these instructions at home: Eating and drinking  Eat a diet that includes fresh fruits and vegetables, whole grains, lean protein, and low-fat dairy.  Take vitamin and mineral supplements as recommended by your health care provider.  Do not drink alcohol if: ? Your health care provider tells you not to drink. ? You are pregnant, may be pregnant, or are planning to become pregnant.  If you drink alcohol: ? Limit how much you have to 0-1 drink a day. ? Be aware of how much alcohol is in your drink. In the [place] one drink equals one 12 oz bottle of beer (355 mL), one 5 oz glass of wine (148 mL), or one 1 oz glass of hard liquor (44 mL). Lifestyle  Take daily care of your teeth and gums.  Stay active. Exercise for at least 30 minutes on 5 or more  days each week.  Do not use any products that contain nicotine or tobacco, such as cigarettes, e-cigarettes, and chewing tobacco. If you need help quitting, ask your health care provider.  If you are sexually active, practice safe sex. Use a condom or other form of birth control (contraception) in order to prevent pregnancy and STIs (sexually transmitted infections).  If told by your health care provider, take low-dose aspirin daily starting at age 67. What's next?  Visit your health care provider once a year for a well check visit.  Ask your health care provider how often you should have your eyes and teeth checked.  Stay up to date on all vaccines. This information is not intended to replace advice given to you by your health care provider. Make sure you discuss any questions you have with your health care provider. Document Revised: 11/13/2017 Document Reviewed: 11/13/2017 Elsevier Patient Education  [place].          Sciatica  Sciatica is pain, weakness, tingling, or loss of feeling (numbness) along the  sciatic nerve. The sciatic nerve starts in the lower back and goes down the back of each leg. Sciatica usually goes away on its own or with treatment. Sometimes, sciatica may come back (recur). What are the causes? This condition happens when the sciatic nerve is pinched or has pressure put on it. This may be the result of:  A disk in between the bones of the spine bulging out too far (herniated disk).  Changes in the spinal disks that occur with aging.  A condition that affects a muscle in the butt.  Extra bone growth near the sciatic nerve.  A break (fracture) of the area between your hip bones (pelvis).  Pregnancy.  Tumor. This is rare. What increases the risk? You are more likely to develop this condition if you:  Play sports that put pressure or stress on the spine.  Have poor strength and ease of movement (flexibility).  Have had a back injury  in the past.  Have had back surgery.  Sit for long periods of time.  Do activities that involve bending or lifting over and over again.  Are very overweight (obese). What are the signs or symptoms? Symptoms can vary from mild to very bad. They may include:  Any of these problems in the lower back, leg, hip, or butt: ? Mild tingling, loss of feeling, or dull aches. ? Burning sensations. ? Sharp pains.  Loss of feeling in the back of the calf or the sole of the foot.  Leg weakness.  Very bad back pain that makes it hard to move. These symptoms may get worse when you cough, sneeze, or laugh. They may also get worse when you sit or stand for long periods of time. How is this treated? This condition often gets better without any treatment. However, treatment may include:  Changing or cutting back on physical activity when you have pain.  Doing exercises and stretching.  Putting ice or heat on the affected area.  Medicines that help: ? To relieve pain and swelling. ? To relax your muscles.  Shots (injections) of medicines that help to relieve pain, irritation, and swelling.  Surgery. Follow these instructions at home: Medicines  Take over-the-counter and prescription medicines only as told by your doctor.  Ask your doctor if the medicine prescribed to you: ? Requires you to avoid driving or using heavy machinery. ? Can cause trouble pooping (constipation). You may need to take these steps to prevent or treat trouble pooping:  Drink enough fluids to keep your pee (urine) pale yellow.  Take over-the-counter or prescription medicines.  Eat foods that are high in fiber. These include beans, whole grains, and fresh fruits and vegetables.  Limit foods that are high in fat and sugar. These include fried or sweet foods. Managing pain      If told, put ice on the affected area. ? Put ice in a plastic bag. ? Place a towel between your skin and the bag. ? Leave the ice  on for 20 minutes, 2-3 times a day.  If told, put heat on the affected area. Use the heat source that your doctor tells you to use, such as a moist heat pack or a heating pad. ? Place a towel between your skin and the heat source. ? Leave the heat on for 20-30 minutes. ? Remove the heat if your skin turns bright red. This is very important if you are unable to feel pain, heat, or cold. You may have a greater risk  of getting burned. Activity   Return to your normal activities as told by your doctor. Ask your doctor what activities are safe for you.  Avoid activities that make your symptoms worse.  Take short rests during the day. ? When you rest for a long time, do some physical activity or stretching between periods of rest. ? Avoid sitting for a long time without moving. Get up and move around at least one time each hour.  Exercise and stretch regularly, as told by your doctor.  Do not lift anything that is heavier than 10 lb (4.5 kg) while you have symptoms of sciatica. ? Avoid lifting heavy things even when you do not have symptoms. ? Avoid lifting heavy things over and over.  When you lift objects, always lift in a way that is safe for your body. To do this, you should: ? Bend your knees. ? Keep the object close to your body. ? Avoid twisting. General instructions  Stay at a healthy weight.  Wear comfortable shoes that support your feet. Avoid wearing high heels.  Avoid sleeping on a mattress that is too soft or too hard. You might have less pain if you sleep on a mattress that is firm enough to support your back.  Keep all follow-up visits as told by your doctor. This is important. Contact a doctor if:  You have pain that: ? Wakes you up when you are sleeping. ? Gets worse when you lie down. ? Is worse than the pain you have had in the past. ? Lasts longer than 4 weeks.  You lose weight without trying. Get help right away if:  You cannot control when you pee  (urinate) or poop (have a bowel movement).  You have weakness in any of these areas and it gets worse: ? Lower back. ? The area between your hip bones. ? Butt. ? Legs.  You have redness or swelling of your back.  You have a burning feeling when you pee. Summary  Sciatica is pain, weakness, tingling, or loss of feeling (numbness) along the sciatic nerve.  This condition happens when the sciatic nerve is pinched or has pressure put on it.  Sciatica can cause pain, tingling, or loss of feeling (numbness) in the lower back, legs, hips, and butt.  Treatment often includes rest, exercise, medicines, and putting ice or heat on the affected area. This information is not intended to replace advice given to you by your health care provider. Make sure you discuss any questions you have with your health care provider. Document Revised: 03/23/2018 Document Reviewed: 03/23/2018 Elsevier Patient Education  [place] DASH stands for "Dietary Approaches to Stop Hypertension." The DASH eating plan is a healthy eating plan that has been shown to reduce high blood pressure (hypertension). It may also reduce your risk for type 2 diabetes, heart disease, and stroke. The DASH eating plan may also help with weight loss. What are tips for following this plan?  General guidelines  Avoid eating more than 2,300 mg (milligrams) of salt (sodium) a day. If you have hypertension, you may need to reduce your sodium intake to 1,500 mg a day.  Limit alcohol intake to no more than 1 drink a day for nonpregnant women and 2 drinks a day for men. One drink equals 12 oz of beer, 5 oz of wine,  or 1 oz of hard liquor.  Work with your health care provider to maintain a healthy body weight or to lose weight. Ask what an ideal weight is for you.  Get at least 30 minutes of exercise that causes your heart to beat faster (aerobic exercise) most days of  the week. Activities may include walking, swimming, or biking.  Work with your health care provider or diet and nutrition specialist (dietitian) to adjust your eating plan to your individual calorie needs. Reading food labels   Check food labels for the amount of sodium per serving. Choose foods with less than 5 percent of the Daily Value of sodium. Generally, foods with less than 300 mg of sodium per serving fit into this eating plan.  To find whole grains, look for the word "whole" as the first word in the ingredient list. Shopping  Buy products labeled as "low-sodium" or "no salt added."  Buy fresh foods. Avoid canned foods and premade or frozen meals. Cooking  Avoid adding salt when cooking. Use salt-free seasonings or herbs instead of table salt or sea salt. Check with your health care provider or pharmacist before using salt substitutes.  Do not fry foods. Cook foods using healthy methods such as baking, boiling, grilling, and broiling instead.  Cook with heart-healthy oils, such as olive, canola, soybean, or sunflower oil. Meal planning  Eat a balanced diet that includes: ? 5 or more servings of fruits and vegetables each day. At each meal, try to fill half of your plate with fruits and vegetables. ? Up to 6-8 servings of whole grains each day. ? Less than 6 oz of lean meat, poultry, or fish each day. A 3-oz serving of meat is about the same size as a deck of cards. One egg equals 1 oz. ? 2 servings of low-fat dairy each day. ? A serving of nuts, seeds, or beans 5 times each week. ? Heart-healthy fats. Healthy fats called Omega-3 fatty acids are found in foods such as flaxseeds and coldwater fish, like sardines, salmon, and mackerel.  Limit how much you eat of the following: ? Canned or prepackaged foods. ? Food that is high in trans fat, such as fried foods. ? Food that is high in saturated fat, such as fatty meat. ? Sweets, desserts, sugary drinks, and other foods with  added sugar. ? Full-fat dairy products.  Do not salt foods before eating.  Try to eat at least 2 vegetarian meals each week.  Eat more home-cooked food and less restaurant, buffet, and fast food.  When eating at a restaurant, ask that your food be prepared with less salt or no salt, if possible. What foods are recommended? The items listed may not be a complete list. Talk with your dietitian about what dietary choices are best for you. Grains Whole-grain or whole-wheat bread. Whole-grain or whole-wheat pasta. Brown rice. [place]. Bulgur. Whole-grain and low-sodium cereals. Pita bread. Low-fat, low-sodium crackers. Whole-wheat flour tortillas. Vegetables Fresh or frozen vegetables (raw, steamed, roasted, or grilled). Low-sodium or reduced-sodium tomato and vegetable juice. Low-sodium or reduced-sodium tomato sauce and tomato paste. Low-sodium or reduced-sodium canned vegetables. Fruits All fresh, dried, or frozen fruit. Canned fruit in natural juice (without added sugar). Meat and other protein foods Skinless chicken or [place]. Ground chicken or [place]. Pork with fat trimmed off. Fish and seafood. Egg whites. Dried beans, peas, or lentils. Unsalted nuts, nut butters, and seeds. Unsalted canned beans. Lean cuts of beef with fat trimmed off. Low-sodium, lean deli  meat. Dairy Low-fat (1%) or fat-free (skim) milk. Fat-free, low-fat, or reduced-fat cheeses. Nonfat, low-sodium ricotta or cottage cheese. Low-fat or nonfat yogurt. Low-fat, low-sodium cheese. Fats and oils Soft margarine without trans fats. Vegetable oil. Low-fat, reduced-fat, or light mayonnaise and salad dressings (reduced-sodium). Canola, safflower, olive, soybean, and sunflower oils. Avocado. Seasoning and other foods Herbs. Spices. Seasoning mixes without salt. Unsalted popcorn and pretzels. Fat-free sweets. What foods are not recommended? The items listed may not be a complete list. Talk with your dietitian about what  dietary choices are best for you. Grains Baked goods made with fat, such as croissants, muffins, or some breads. Dry pasta or rice meal packs. Vegetables Creamed or fried vegetables. Vegetables in a cheese sauce. Regular canned vegetables (not low-sodium or reduced-sodium). Regular canned tomato sauce and paste (not low-sodium or reduced-sodium). Regular tomato and vegetable juice (not low-sodium or reduced-sodium). Angie Fava. Olives. Fruits Canned fruit in a light or heavy syrup. Fried fruit. Fruit in cream or butter sauce. Meat and other protein foods Fatty cuts of meat. Ribs. Fried meat. Berniece Salines. Sausage. Bologna and other processed lunch meats. Salami. Fatback. Hotdogs. Bratwurst. Salted nuts and seeds. Canned beans with added salt. Canned or smoked fish. Whole eggs or egg yolks. Chicken or [place] with skin. Dairy Whole or 2% milk, cream, and half-and-half. Whole or full-fat cream cheese. Whole-fat or sweetened yogurt. Full-fat cheese. Nondairy creamers. Whipped toppings. Processed cheese and cheese spreads. Fats and oils Butter. Stick margarine. Lard. Shortening. Ghee. Bacon fat. Tropical oils, such as coconut, palm kernel, or palm oil. Seasoning and other foods Salted popcorn and pretzels. Onion salt, garlic salt, seasoned salt, table salt, and sea salt. Worcestershire sauce. Tartar sauce. Barbecue sauce. Teriyaki sauce. Soy sauce, including reduced-sodium. Steak sauce. Canned and packaged gravies. Fish sauce. Oyster sauce. Cocktail sauce. Horseradish that you find on the shelf. Ketchup. Mustard. Meat flavorings and tenderizers. Bouillon cubes. Hot sauce and Tabasco sauce. Premade or packaged marinades. Premade or packaged taco seasonings. Relishes. Regular salad dressings. Where to find more information:  National Heart, Lung, and [place]: https://wilson-eaton.com/  American Heart Association: www.heart.org Summary  The DASH eating plan is a healthy eating plan that has been shown to reduce  high blood pressure (hypertension). It may also reduce your risk for type 2 diabetes, heart disease, and stroke.  With the DASH eating plan, you should limit salt (sodium) intake to 2,300 mg a day. If you have hypertension, you may need to reduce your sodium intake to 1,500 mg a day.  When on the DASH eating plan, aim to eat more fresh fruits and vegetables, whole grains, lean proteins, low-fat dairy, and heart-healthy fats.  Work with your health care provider or diet and nutrition specialist (dietitian) to adjust your eating plan to your individual calorie needs. This information is not intended to replace advice given to you by your health care provider. Make sure you discuss any questions you have with your health care provider. Document Revised: 02/14/2017 Document Reviewed: 02/26/2016 Elsevier Patient Education  2020 [employer].

## 2019-05-20 NOTE — Progress Notes (Signed)
Patient ID: Olivia Gutierrez, female    DOB: 07/25/1955, 64 y.o.   MRN: [id number]  PCP: Benito Mccreedy, MD  Chief Complaint  Patient presents with  . Back Pain    Subjective:  HPI Olivia Gutierrez is a 64 y.o. female presents for evaluation back pain.  Medical history significant for Hypertension, obesity, allergy includes asthma.  Back Pain  Patient endorses a history of sciatica type back pain. No known injury. Pain is localized to the right lower lumbar and radiates down the right leg. She has experienced back pain previously which resolved with conservative OTC treatment and rest. This pain has worsened and was not relieved with taking Advil. Endorses weakness in right leg. Pain is worsened by prolonged sitting and lying down. Endorses a history   Health Screening /Hypertension Patient denies any recent physical or blood work. She typically receives care at the ER or UC. She has hypertension and occasional checks blood pressure at home. Blood pressure readings have been elevated at home. She is out of blood pressure medication. Overdue for mammogram, never had one due to fear of study. She has allergy induced asthma which she is currently experiencing cough with increased working breathing when exposed to outdoor elements. She denies any shortness of breath, chest pain, dizziness, or any other concerns today. She is fasting today and agrees to screening labs today.  Social History   Socioeconomic History  . Marital status: Married    Spouse name: Not on file  . Number of children: 0  . Years of education: Not on file  . Highest education level: Not on file  Occupational History  . Occupation: Wellsite geologist  Tobacco Use  . Smoking status: Never Smoker  . Smokeless tobacco: Never Used  Substance and Sexual Activity  . Alcohol use: No  . Drug use: No  . Sexual activity: Yes    Birth control/protection: Post-menopausal  Other Topics Concern  . Not  on file  Social History Narrative  . Not on file   Social Determinants of Health   Financial Resource Strain:   . Difficulty of Paying Living Expenses: Not on file  Food Insecurity:   . Worried About Charity fundraiser in the Last Year: Not on file  . Ran Out of Food in the Last Year: Not on file  Transportation Needs:   . Lack of Transportation (Medical): Not on file  . Lack of Transportation (Non-Medical): Not on file  Physical Activity:   . Days of Exercise per Week: Not on file  . Minutes of Exercise per Session: Not on file  Stress:   . Feeling of Stress : Not on file  Social Connections:   . Frequency of Communication with Friends and Family: Not on file  . Frequency of Social Gatherings with Friends and Family: Not on file  . Attends Religious Services: Not on file  . Active Member of Clubs or Organizations: Not on file  . Attends Archivist Meetings: Not on file  . Marital Status: Not on file  Intimate Partner Violence:   . Fear of Current or Ex-Partner: Not on file  . Emotionally Abused: Not on file  . Physically Abused: Not on file  . Sexually Abused: Not on file    Family History  Problem Relation Age of Onset  . Heart attack Mother   . Stroke Father   . Lung disease Father   . Heart failure Sister   . Heart attack Sister   .  Colon cancer Brother   . Cystic fibrosis Sister        x 4     Review of Systems  Pertinent negatives listed in HPI  Patient Active Problem List   Diagnosis Date Noted  . SOB (shortness of breath) 09/29/2014  . Essential hypertension 09/29/2014  . Chronic anemia 09/29/2014  . Stricture and stenosis of esophagus 11/19/2013  . Special screening for malignant neoplasms, colon 10/20/2013  . Esophageal reflux 10/20/2013  . Dysphagia 10/20/2013     Prior to Admission medications   Medication Sig Start Date End Date Taking? Authorizing Provider  albuterol (PROVENTIL HFA;VENTOLIN HFA) 108 (90 BASE) MCG/ACT inhaler  Inhale 1-2 puffs into the lungs every 6 (six) hours as needed for wheezing or shortness of breath.    [provider]  ibuprofen (ADVIL,MOTRIN) 200 MG tablet Take 400 mg by mouth every 6 (six) hours as needed for mild pain or moderate pain.    [provider]  nitrofurantoin, macrocrystal-monohydrate, (MACROBID) 100 MG capsule Take 1 capsule (100 mg total) by mouth 2 (two) times daily. 01/04/19   Loura Halt A, NP  ranitidine (ZANTAC) 300 MG tablet Take 1 tablet (300 mg total) by mouth at bedtime. 07/11/17   Robyn Haber, MD  triamterene-hydrochlorothiazide (MAXZIDE-25) 37.5-25 MG tablet Take 1 tablet by mouth daily. 07/11/17   Robyn Haber, MD    Past Medical, Surgical Family and Social History reviewed and updated.    Objective:   Today's Vitals   05/20/19 1047  BP: 139/87  Pulse: 69  Resp: 18  Temp: 98.2 F (36.8 C)  TempSrc: Oral  SpO2: 98%  Height: 5\' 7"  (1.702 m)  PainSc: 7   PainLoc: Back    Wt Readings from Last 3 Encounters:  11/06/16 280 lb (127 kg)  09/29/14 284 lb 6.4 oz (129 kg)  11/19/13 272 lb (123.4 kg)   Physical Exam Constitutional: Patient appears well-developed and well-nourished. No distress. HENT: Normocephalic, atraumatic, External right and left ear normal.  Eyes: Conjunctivae and EOM are normal. PERRLA, no scleral icterus. Neck: Normal ROM. Neck supple. No JVD. No tracheal deviation. No thyromegaly. CVS: RRR, S1/S2 +, no murmurs, no gallops, no carotid bruit.  Pulmonary: Effort and breath sounds normal, no stridor, rhonchi, wheezes, rales.  Abdominal: Soft. BS +, no distension, tenderness, rebound or guarding.  Musculoskeletal: positive lumbar-sacral right tenderness Neuro: Alert. Normal reflexes, muscle tone coordination. No cranial nerve deficit. Skin: Skin is warm and dry. No rash noted. Not diaphoretic. No erythema. No pallor. Psychiatric: Normal mood and affect. Behavior, judgment, thought content normal.   Assessment &  Plan:  1. Sciatica of right side - ketorolac (TORADOL) 30 MG/ML injection 30 mg and solumedrol 125 mg IM given in clinic -StartPrednisone 20 mg,  in mornings with breakfast as follows:  Take 3 pills for 3 days, Take 2 pills for 3 days, and Take 1 pill for 3 days.  Complete all medication. -Cyclobenzaprine 10 mg BID PRN for pain  2. Encounter for health-related screening - Comprehensive metabolic panel - CBC with Differential  3. Screening for diabetes mellitus A1C normal range 5.3  4. Morbid obesity ([place]) Encouraged efforts to reduce weight include engaging in physical activity as tolerated with goal of 150 minutes per week. Improve dietary choices and eat a meal regimen consistent with a Mediterranean or DASH diet. Reduce simple carbohydrates. Do not skip meals and eat healthy snacks throughout the day to avoid over-eating at dinner. Set a goal weight loss that is achievable  for you.    5. Mild intermittent extrinsic asthma without complication -Encouraged antihistamine use to prevent exacerbations.  - albuterol (VENTOLIN HFA) 108 (90 Base) MCG/ACT inhaler 2 puff, then 2 puffs every 4-6 hours as needed.  6. Encounter for hepatitis C virus screening test for high risk patient - Hepatitis c vrs RNA detect by PCR-qual  7. Encounter for screening for HIV -HIV with reflex   8. Essential hypertension, stable  We have discussed target BP range and blood pressure goal. I have advised patient to check BP regularly and to call us back or report to clinic if the numbers are consistently higher than 130/90. We discussed the importance of compliance with medical therapy and DASH diet recommended, consequences of uncontrolled hypertension discussed.  - Continue current BP medications, refilled triamterene-hydrochlorithizade 6 months. - Comprehensive metabolic panel-renal and electrolyte function  - Lipid panel, fasting, pending  9. Encounter for vitamin deficiency screening - Vitamin D,  25-hydroxy  10. Screening for thyroid disorder - Thyroid Panel With TSH  11. Breast cancer screening by mammogram - MM 3D SCREEN BREAST BILATERAL; Future     -The patient was given clear instructions to go to ER or return to medical center if symptoms do not improve, worsen or new problems develop. The patient verbalized understanding.     Carroll Sage. Kenton Kingfisher, MSN, [facility]  [address] [place], [place], [place] [address] [phone]

## 2019-05-21 ENCOUNTER — Ambulatory Visit: Payer: Self-pay | Attending: Internal Medicine

## 2019-05-21 DIAGNOSIS — Z23 Encounter for immunization: Secondary | ICD-10-CM | POA: Insufficient documentation

## 2019-05-21 LAB — HIV ANTIBODY (ROUTINE TESTING W REFLEX): HIV Screen 4th Generation wRfx: NONREACTIVE

## 2019-05-21 NOTE — Progress Notes (Signed)
   Covid-19 Vaccination Clinic  Name:  Olivia Gutierrez    MRN: [id number] DOB: Oct 31, 1955  05/21/2019  [name] was observed post Covid-19 immunization for 15 minutes without incident. She was provided with Vaccine Information Sheet and instruction to access the V-Safe system.   [name] was instructed to call 911 with any severe reactions post vaccine: Marland Kitchen Difficulty breathing  . Swelling of face and throat  . A fast heartbeat  . A bad rash all over body  . Dizziness and weakness   Immunizations Administered    Name Date Dose VIS Date Route   Pfizer COVID-19 Vaccine 05/21/2019  1:05 PM 0.3 mL 02/26/2019 Intramuscular   Manufacturer: [place]   Lot: [id number]   [place]: [id number]

## 2019-05-23 LAB — CBC WITH DIFFERENTIAL/PLATELET
Basophils Absolute: 0 10*3/uL (ref 0.0–0.2)
Basos: 1 %
EOS (ABSOLUTE): 0.3 10*3/uL (ref 0.0–0.4)
Eos: 5 %
Hematocrit: 37.3 % (ref 34.0–46.6)
Hemoglobin: 12 g/dL (ref 11.1–15.9)
Immature Grans (Abs): 0 10*3/uL (ref 0.0–0.1)
Immature Granulocytes: 1 %
Lymphocytes Absolute: 1.1 10*3/uL (ref 0.7–3.1)
Lymphs: 20 %
MCH: 28 pg (ref 26.6–33.0)
MCHC: 32.2 g/dL (ref 31.5–35.7)
MCV: 87 fL (ref 79–97)
Monocytes Absolute: 0.4 10*3/uL (ref 0.1–0.9)
Monocytes: 7 %
Neutrophils Absolute: 3.9 10*3/uL (ref 1.4–7.0)
Neutrophils: 66 %
Platelets: 307 10*3/uL (ref 150–450)
RBC: 4.29 x10E6/uL (ref 3.77–5.28)
RDW: 13.8 % (ref 11.7–15.4)
WBC: 5.7 10*3/uL (ref 3.4–10.8)

## 2019-05-23 LAB — COMPREHENSIVE METABOLIC PANEL
ALT: 8 IU/L (ref 0–32)
AST: 8 IU/L (ref 0–40)
Albumin/Globulin Ratio: 1.8 (ref 1.2–2.2)
Albumin: 4 g/dL (ref 3.8–4.8)
Alkaline Phosphatase: 90 IU/L (ref 39–117)
BUN/Creatinine Ratio: 19 (ref 12–28)
BUN: 15 mg/dL (ref 8–27)
Bilirubin Total: <0.2 mg/dL (ref 0.0–1.2)
CO2: 27 mmol/L (ref 20–29)
Calcium: 9.4 mg/dL (ref 8.7–10.3)
Chloride: 105 mmol/L (ref 96–106)
Creatinine, Ser: 0.77 mg/dL (ref 0.57–1.00)
GFR calc Af Amer: 96 mL/min/{1.73_m2} (ref >59)
GFR calc non Af Amer: 83 mL/min/{1.73_m2} (ref >59)
Globulin, Total: 2.2 g/dL (ref 1.5–4.5)
Glucose: 81 mg/dL (ref 65–99)
Potassium: 4.6 mmol/L (ref 3.5–5.2)
Sodium: 141 mmol/L (ref 134–144)
Total Protein: 6.2 g/dL (ref 6.0–8.5)

## 2019-05-23 LAB — VITAMIN D 25 HYDROXY (VIT D DEFICIENCY, FRACTURES): Vit D, 25-Hydroxy: 14.4 ng/mL — ABNORMAL LOW (ref 30.0–100.0)

## 2019-05-23 LAB — LIPID PANEL
Chol/HDL Ratio: 2.8 ratio (ref 0.0–4.4)
Cholesterol, Total: 193 mg/dL (ref 100–199)
HDL: 69 mg/dL (ref >39)
LDL Chol Calc (NIH): 109 mg/dL — ABNORMAL HIGH (ref 0–99)
Triglycerides: 83 mg/dL (ref 0–149)
VLDL Cholesterol Cal: 15 mg/dL (ref 5–40)

## 2019-05-23 LAB — THYROID PANEL WITH TSH
Free Thyroxine Index: 1.2 (ref 1.2–4.9)
T3 Uptake Ratio: 25 % (ref 24–39)
T4, Total: 4.8 ug/dL (ref 4.5–12.0)
TSH: 2.05 u[IU]/mL (ref 0.450–4.500)

## 2019-05-23 LAB — HEPATITIS C VRS RNA DETECT BY PCR-QUAL: HCV RNA NAA Qualitative: NEGATIVE

## 2019-05-25 ENCOUNTER — Telehealth: Payer: Self-pay | Admitting: *Deleted

## 2019-05-25 NOTE — Telephone Encounter (Signed)
Patient verified DOB Patient is aware of labs being normal except for vitamin d level being low and needing to take weekly supplement. Patient also advised to limit carb and salt intake to address slightly elevated LDL. Patient scheduled for PCE in July for HTN FU.

## 2019-05-25 NOTE — Telephone Encounter (Signed)
-----   Message from Scot Jun, [place] sent at 05/25/2019  8:59 AM EST ----- Notify patient of the following regarding her labs:  All results were within normal range with the exception of vitamin D level is low and LDL (bad cholesterol) was mildly elevated. I am prescribing ergocalciferol  50,000 units once weekly for 12 weeks to replace vitamin D-once completed recommend over the counter  vitamin D 3 at 2000 units per day following high dose treatment. Encourage routine daily exercise as tolerated such as walking and reducing food high in saturated fat such as fried foods or meats that contain fat and select leaner meats.Please schedule patient a follow-up at [facility] or [facility] in 6 months for blood pressure follow-up.

## 2019-05-26 ENCOUNTER — Other Ambulatory Visit: Payer: Self-pay | Admitting: Family Medicine

## 2019-05-26 MED ORDER — VITAMIN D (ERGOCALCIFEROL) 1.25 MG (50000 UNIT) PO CAPS: 50000.0000 [IU] | ORAL_CAPSULE | ORAL | 0 refills | Status: DC

## 2019-05-26 NOTE — Progress Notes (Signed)
Send Vitamin D to pharmacy on file

## 2019-06-16 ENCOUNTER — Ambulatory Visit: Payer: Self-pay | Attending: Internal Medicine

## 2019-06-16 DIAGNOSIS — Z23 Encounter for immunization: Secondary | ICD-10-CM

## 2019-06-16 NOTE — Progress Notes (Signed)
   Covid-19 Vaccination Clinic  Name:  Olivia Gutierrez    MRN: [id number] DOB: [birth year]  06/16/2019  Olivia Gutierrez was observed post Covid-19 immunization for 15 minutes without incident. She was provided with Vaccine Information Sheet and instruction to access the V-Safe system.   Olivia Gutierrez was instructed to call 911 with any severe reactions post vaccine: Marland Kitchen Difficulty breathing  . Swelling of face and throat  . A fast heartbeat  . A bad rash all over body  . Dizziness and weakness   Immunizations Administered    Name Date Dose VIS Date Route   Pfizer COVID-19 Vaccine 06/16/2019  2:03 PM 0.3 mL 02/26/2019 Intramuscular   Manufacturer: [place]   Lot: [id number]   [place]: [id number]

## 2019-09-22 ENCOUNTER — Ambulatory Visit: Payer: Self-pay | Admitting: Internal Medicine

## 2019-11-09 ENCOUNTER — Telehealth (INDEPENDENT_AMBULATORY_CARE_PROVIDER_SITE_OTHER): Payer: Self-pay | Admitting: Internal Medicine

## 2019-11-09 ENCOUNTER — Encounter: Payer: Self-pay | Admitting: Internal Medicine

## 2019-11-09 DIAGNOSIS — S0990XA Unspecified injury of head, initial encounter: Secondary | ICD-10-CM

## 2019-11-09 DIAGNOSIS — I10 Essential (primary) hypertension: Secondary | ICD-10-CM

## 2019-11-09 NOTE — Progress Notes (Signed)
Virtual Visit via Telephone Note  I connected with Olivia Gutierrez, on 11/09/2019 at 9:35 AM by telephone due to the COVID-19 pandemic and verified that I am speaking with the correct person using two identifiers.   Consent: I discussed the limitations, risks, security and privacy concerns of performing an evaluation and management service by telephone and the availability of in person appointments. I also discussed with the patient that there may be a patient responsible charge related to this service. The patient expressed understanding and agreed to proceed.   Location of Patient: Home   Location of Provider: Clinic    Persons participating in Telemedicine visit: Demetris Daishia Fetterly [facility] Dr. Juleen China      History of Present Illness: Patient has a visit to follow up on HTN. Reports compliance with Maxzide 37.5-25 mg. BP has been running 160s/90s. She has been taking two of the tablets to try to get the number down.   She reports history of head trauma. She was leaning into a door trying to get it close on a track. The door finally gave way and slammed into her while her head was in the doorframe. She had trauma from her forehead down to her ear. This occurred a little less than a week ago. Report that she had a lot of swelling. She has been having headaches and nausea. Feels like she isn't remembering like she should and family member on the phone agrees.    Past Medical History:  Diagnosis Date  . Anemia   . Arthritis   . Asthma   . Diverticulosis   . GERD (gastroesophageal reflux disease)   . Hyperlipidemia   . Hypertension   . Morbid obesity (HCC)    Allergies  Allergen Reactions  . Other Shortness Of Breath and Itching    Certain peanuts.. Pecans, walnuts  . Penicillins Hives and Rash    Has patient had a PCN reaction causing immediate rash, facial/tongue/throat swelling, SOB or lightheadedness with hypotension:unknown Has patient had a PCN  reaction causing severe rash involving mucus membranes or skin necrosis: unknown Has patient had a PCN reaction that required hospitalization : yes Has patient had a PCN reaction occurring within the last 10 years: no, childhood allergy If all of the above answers are "NO", then may proceed with Cephalosporin use.     Current Outpatient Medications on File Prior to Visit  Medication Sig Dispense Refill  . triamterene-hydrochlorothiazide (MAXZIDE-25) 37.5-25 MG tablet Take 1 tablet by mouth daily. 90 tablet 1   No current facility-administered medications on file prior to visit.    Observations/Objective: NAD. Speaking clearly.  Work of breathing normal.  Alert and oriented. Mood appropriate.   Assessment and Plan: 1. Essential hypertension BP has been elevated at home and patient has been doubling up on her medications. While the nausea and headaches could certainly be related to the head injury, would like patient to be evaluated for hypertensive urgency/emergency. Have recommended given the complex nature of her presentation and recent history, that she be evaluated in the ED. Would also need lab work given she has increased her diuretic dosing.    2. Traumatic injury of head, initial encounter Symptoms sound most concerning for concussion. However, I am concerned that patient seems to have a slowed and slurred speech pattern on exam. I would like her to be evaluated in person for neurological deficits and given what sounded to be fairly severe head injury, may warrant imaging as well.    Follow Up Instructions:  Go to ED for evaluation    I discussed the assessment and treatment plan with the patient. The patient was provided an opportunity to ask questions and all were answered. The patient agreed with the plan and demonstrated an understanding of the instructions.   The patient was advised to call back or seek an in-person evaluation if the symptoms worsen or if the condition  fails to improve as anticipated.     I provided 26 minutes total of non-face-to-face time during this encounter including median intraservice time, reviewing previous notes, investigations, ordering medications, medical decision making, coordinating care and patient verbalized understanding at the end of the visit.    Phill Myron, D.O. Primary Care at [facility]  11/09/2019, 9:35 AM

## 2019-11-10 ENCOUNTER — Encounter (HOSPITAL_COMMUNITY): Payer: Self-pay | Admitting: Emergency Medicine

## 2019-11-10 ENCOUNTER — Emergency Department (HOSPITAL_COMMUNITY)
Admission: EM | Admit: 2019-11-10 | Discharge: 2019-11-10 | Disposition: A | Discharge status: Home / Self Care | Payer: Self-pay | Attending: Emergency Medicine | Admitting: Emergency Medicine

## 2019-11-10 DIAGNOSIS — S0990XA Unspecified injury of head, initial encounter: Secondary | ICD-10-CM | POA: Insufficient documentation

## 2019-11-10 DIAGNOSIS — X58XXXA Exposure to other specified factors, initial encounter: Secondary | ICD-10-CM | POA: Insufficient documentation

## 2019-11-10 DIAGNOSIS — Y939 Activity, unspecified: Secondary | ICD-10-CM | POA: Insufficient documentation

## 2019-11-10 DIAGNOSIS — Y929 Unspecified place or not applicable: Secondary | ICD-10-CM | POA: Insufficient documentation

## 2019-11-10 DIAGNOSIS — Z5321 Procedure and treatment not carried out due to patient leaving prior to being seen by health care provider: Secondary | ICD-10-CM | POA: Insufficient documentation

## 2019-11-10 DIAGNOSIS — Y999 Unspecified external cause status: Secondary | ICD-10-CM | POA: Insufficient documentation

## 2019-11-10 NOTE — ED Notes (Signed)
Pt left due to wait time  

## 2019-11-10 NOTE — ED Triage Notes (Signed)
Pt states that she was trying to shut a new sliding door at her home and smashed her head between the door and the frame. Reports incident happened 1 week ago, has been taking tylenol and using ice. States her pcp wanted her to come in for further eval. Pt a/x4, speech clear, resp e/u, nad.

## 2020-03-30 ENCOUNTER — Ambulatory Visit: Payer: Self-pay

## 2020-10-26 ENCOUNTER — Ambulatory Visit
Admission: EM | Admit: 2020-10-26 | Discharge: 2020-10-26 | Disposition: A | Discharge status: Home / Self Care | Payer: No Typology Code available for payment source

## 2020-10-26 ENCOUNTER — Emergency Department (HOSPITAL_COMMUNITY)
Admission: EM | Admit: 2020-10-26 | Discharge: 2020-10-27 | Disposition: A | Discharge status: Home / Self Care | Payer: No Typology Code available for payment source | Attending: Emergency Medicine | Admitting: Emergency Medicine

## 2020-10-26 ENCOUNTER — Other Ambulatory Visit: Payer: Self-pay

## 2020-10-26 ENCOUNTER — Encounter: Payer: Self-pay | Admitting: Emergency Medicine

## 2020-10-26 ENCOUNTER — Encounter (HOSPITAL_COMMUNITY): Payer: Self-pay

## 2020-10-26 ENCOUNTER — Emergency Department (HOSPITAL_COMMUNITY): Payer: No Typology Code available for payment source

## 2020-10-26 DIAGNOSIS — I160 Hypertensive urgency: Secondary | ICD-10-CM

## 2020-10-26 DIAGNOSIS — I10 Essential (primary) hypertension: Secondary | ICD-10-CM

## 2020-10-26 DIAGNOSIS — R519 Headache, unspecified: Secondary | ICD-10-CM

## 2020-10-26 DIAGNOSIS — J45909 Unspecified asthma, uncomplicated: Secondary | ICD-10-CM | POA: Insufficient documentation

## 2020-10-26 DIAGNOSIS — Z79899 Other long term (current) drug therapy: Secondary | ICD-10-CM | POA: Insufficient documentation

## 2020-10-26 LAB — BASIC METABOLIC PANEL
Anion gap: 9 (ref 5–15)
BUN: 13 mg/dL (ref 8–23)
CO2: 27 mmol/L (ref 22–32)
Calcium: 9.6 mg/dL (ref 8.9–10.3)
Chloride: 102 mmol/L (ref 98–111)
Creatinine, Ser: 0.72 mg/dL (ref 0.44–1.00)
GFR, Estimated: >60 mL/min (ref >60)
Glucose, Bld: 93 mg/dL (ref 70–99)
Potassium: 3.8 mmol/L (ref 3.5–5.1)
Sodium: 138 mmol/L (ref 135–145)

## 2020-10-26 LAB — CBC WITH DIFFERENTIAL/PLATELET
Abs Immature Granulocytes: 0.02 10*3/uL (ref 0.00–0.07)
Basophils Absolute: 0 10*3/uL (ref 0.0–0.1)
Basophils Relative: 1 %
Eosinophils Absolute: 0.3 10*3/uL (ref 0.0–0.5)
Eosinophils Relative: 5 %
HCT: 38 % (ref 36.0–46.0)
Hemoglobin: 11.9 g/dL — ABNORMAL LOW (ref 12.0–15.0)
Immature Granulocytes: 0 %
Lymphocytes Relative: 31 %
Lymphs Abs: 1.6 10*3/uL (ref 0.7–4.0)
MCH: 28 pg (ref 26.0–34.0)
MCHC: 31.3 g/dL (ref 30.0–36.0)
MCV: 89.4 fL (ref 80.0–100.0)
Monocytes Absolute: 0.4 10*3/uL (ref 0.1–1.0)
Monocytes Relative: 7 %
Neutro Abs: 2.9 10*3/uL (ref 1.7–7.7)
Neutrophils Relative %: 56 %
Platelets: 287 10*3/uL (ref 150–400)
RBC: 4.25 MIL/uL (ref 3.87–5.11)
RDW: 14.7 % (ref 11.5–15.5)
WBC: 5.2 10*3/uL (ref 4.0–10.5)
nRBC: 0 % (ref 0.0–0.2)

## 2020-10-26 LAB — TROPONIN I (HIGH SENSITIVITY): Troponin I (High Sensitivity): 13 ng/L (ref <18)

## 2020-10-26 MED ORDER — TRIAMTERENE-HCTZ 37.5-25 MG PO TABS: 1.0000 | ORAL_TABLET | Freq: Every day | ORAL | 1 refills | Status: DC

## 2020-10-26 MED ORDER — DIPHENHYDRAMINE HCL 50 MG/ML IJ SOLN
12.5000 mg | Freq: Once | INTRAMUSCULAR | Status: AC
  Administered 2020-10-26: 12.5 mg via INTRAVENOUS
  Filled 2020-10-26: qty 1

## 2020-10-26 MED ORDER — PROCHLORPERAZINE EDISYLATE 10 MG/2ML IJ SOLN
10.0000 mg | Freq: Once | INTRAMUSCULAR | Status: AC
  Administered 2020-10-26: 10 mg via INTRAVENOUS
  Filled 2020-10-26: qty 2

## 2020-10-26 MED ORDER — AMLODIPINE BESYLATE 5 MG PO TABS
5.0000 mg | ORAL_TABLET | Freq: Once | ORAL | Status: AC
  Administered 2020-10-26: 5 mg via ORAL
  Filled 2020-10-26: qty 1

## 2020-10-26 NOTE — ED Provider Notes (Addendum)
Emergency Medicine Provider Triage Evaluation Note  Olivia Gutierrez , a 65 y.o. female  was evaluated in triage.  Pt complains of hypertension and headache that started a few days ago. Denies vision changes or other neurologic complaints  Review of Systems  Positive: HTN, headache Negative: Chest pain, sob  Physical Exam  BP (!) 159/102 (BP Location: Left Arm)   Pulse 68   Temp 98.4 F (36.9 C) (Oral)   Resp 16   Ht '5\' 7"'$  (1.702 m)   Wt 127 kg   SpO2 100%   BMI 43.85 kg/m  Gen:   Awake, no distress   Resp:  Normal effort  MSK:   Moves extremities without difficulty  Other:  Clear speech  Medical Decision Making  Medically screening exam initiated at 4:48 PM.  Appropriate orders placed.  Olivia Gutierrez was informed that the remainder of the evaluation will be completed by another provider, this initial triage assessment does not replace that evaluation, and the importance of remaining in the ED until their evaluation is complete.     Rodney Booze, PA-C 10/26/20 1649    Rodney Booze, PA-C 10/26/20 1649    Teressa Lower, MD 10/26/20 1807

## 2020-10-26 NOTE — Discharge Instructions (Addendum)
We have called EMS to transport you safely to the hospital.  Unfortunately you are in need of a higher level of care than we can provide in the urgent care setting.  My concern is that you have an acute encephalopathy, possible stroke, TIA, aneurysm and need to rule out of this using a head CT scan.

## 2020-10-26 NOTE — ED Triage Notes (Signed)
Patient began having posterior headache yesterday she attributes to when her BP rises. Says she ran out of her BP meds months ago but never went to get a refill on her prescription.

## 2020-10-26 NOTE — ED Triage Notes (Addendum)
Per patient sent from urgent care for HTN and headache, pt states she has been out of her meds for 6 months and has been taking her husbands meds that passed away 3 years ago.

## 2020-10-26 NOTE — Discharge Instructions (Addendum)
Start taking your blood pressure medications.  Follow-up with your primary care doctor to have that rechecked.

## 2020-10-26 NOTE — ED Provider Notes (Signed)
[facility] DEPT Provider Note   CSN: [id number] Arrival date & time: 10/26/20  1556     History Chief Complaint  Patient presents with   Headache   Hypertension    Olivia Gutierrez is a 65 y.o. female.   Headache Hypertension Associated symptoms include headaches.    Pt went to a doctors office today and her blood pressure was elevated in the 180s.  She was sent to the ED from the urgent care.  Patient states she has a history of hypertension.  She has not been taking medication for several months.  Patient has been trying home remedies.  Last few days she has been having trouble with headaches in the back of her head.  No thunderclap onset.  She also had some lightheadedness and dizziness as well as some photophobia.  She has tried taking Tylenol without relief.  She is not having any trouble with chest pain nausea vomiting abdominal pain.  Past Medical History:  Diagnosis Date   Anemia    Arthritis    Asthma    Diverticulosis    GERD (gastroesophageal reflux disease)    Hyperlipidemia    Hypertension    Morbid obesity [facility])     Patient Active Problem List   Diagnosis Date Noted   SOB (shortness of breath) 09/29/2014   Essential hypertension 09/29/2014   Chronic anemia 09/29/2014   Stricture and stenosis of esophagus 11/19/2013   Special screening for malignant neoplasms, colon 10/20/2013   Esophageal reflux 10/20/2013   Dysphagia 10/20/2013    Past Surgical History:  Procedure Laterality Date   none       OB History   No obstetric history on file.     Family History  Problem Relation Age of Onset   Heart attack Mother    Stroke Father    Lung disease Father    Heart failure Sister    Heart attack Sister    Colon cancer Brother    Cystic fibrosis Sister        x 4    Social History   Tobacco Use   Smoking status: Never   Smokeless tobacco: Never  Vaping Use   Vaping Use: Never used  Substance Use Topics    Alcohol use: No   Drug use: No    Home Medications Prior to Admission medications   Medication Sig Start Date End Date Taking? Authorizing Provider  triamterene-hydrochlorothiazide (MAXZIDE-25) 37.5-25 MG tablet Take 1 tablet by mouth daily. 10/26/20   Dorie Rank, MD    Allergies    Other and Penicillins  Review of Systems   Review of Systems  Neurological:  Positive for headaches.  All other systems reviewed and are negative.  Physical Exam Updated Vital Signs BP (!) 160/92   Pulse (!) 57   Temp 98.4 F (36.9 C) (Oral)   Resp 17   Ht 1.702 m ('5\' 7"'$ )   Wt 127 kg   SpO2 98%   BMI 43.85 kg/m   Physical Exam Vitals and nursing note reviewed.  Constitutional:      General: She is not in acute distress.    Appearance: She is well-developed.  HENT:     Head: Normocephalic and atraumatic.     Right Ear: External ear normal.     Left Ear: External ear normal.  Eyes:     General: No scleral icterus.       Right eye: No discharge.        Left  eye: No discharge.     Conjunctiva/sclera: Conjunctivae normal.  Neck:     Trachea: No tracheal deviation.  Cardiovascular:     Rate and Rhythm: Normal rate and regular rhythm.  Pulmonary:     Effort: Pulmonary effort is normal. No respiratory distress.     Breath sounds: Normal breath sounds. No stridor. No wheezing or rales.  Abdominal:     General: Bowel sounds are normal. There is no distension.     Palpations: Abdomen is soft.     Tenderness: There is no abdominal tenderness. There is no guarding or rebound.  Musculoskeletal:        General: No tenderness.     Cervical back: Neck supple.  Skin:    General: Skin is warm and dry.     Findings: No rash.  Neurological:     Mental Status: She is alert and oriented to person, place, and time.     Cranial Nerves: No cranial nerve deficit (No facial droop, extraocular movements intact, tongue midline ).     Sensory: No sensory deficit.     Motor: No abnormal muscle tone or  seizure activity.     Coordination: Coordination normal.     Comments: No pronator drift bilateral upper extrem, able to hold both legs off bed for 5 seconds, sensation intact in all extremities, no visual field cuts, no left or right sided neglect, normal finger-nose exam bilaterally, no nystagmus noted     ED Results / Procedures / Treatments   Labs (all labs ordered are listed, but only abnormal results are displayed) Labs Reviewed  CBC WITH DIFFERENTIAL/PLATELET - Abnormal; Notable for the following components:      Result Value   Hemoglobin 11.9 (*)    All other components within normal limits  BASIC METABOLIC PANEL  TROPONIN I (HIGH SENSITIVITY)    EKG EKG Interpretation  Date/Time:  Thursday October 26 2020 21:02:54 EDT Ventricular Rate:  60 PR Interval:  118 QRS Duration: 142 QT Interval:  418 QTC Calculation: 418 R Axis:   82 Text Interpretation: Sinus rhythm Borderline short PR interval Right bundle branch block New since previous tracing Minimal voltage criteria for left ventricular hypertrophy Artifact multiple leads T wave inversion now evident in leads II, III, aVF Confirmed by Regan Lemming ([id number]) on 10/26/2020 9:19:32 PM  Radiology CT HEAD WO CONTRAST (5MM)  Result Date: 10/26/2020 CLINICAL DATA:  Hypertension, headache, denies vision change or other neurologic symptoms. EXAM: CT HEAD WITHOUT CONTRAST TECHNIQUE: Contiguous axial images were obtained from the base of the skull through the vertex without intravenous contrast. COMPARISON:  CT 08/07/2009 FINDINGS: Brain: Few stable remote lacunar type infarcts seen in the bilateral basal ganglia. No evidence of acute infarction, hemorrhage, hydrocephalus, extra-axial collection, visible mass lesion or mass effect. Symmetric prominence of the ventricles, cisterns and sulci compatible with parenchymal volume loss. Patchy areas of white matter hypoattenuation are most compatible with chronic microvascular angiopathy. Vascular:  No hyperdense vessel or unexpected calcification. Skull: No calvarial fracture or suspicious osseous lesion. No scalp swelling or hematoma. Sinuses/Orbits: Paranasal sinuses and mastoid air cells are predominantly clear. Included orbital structures are unremarkable. Other: None. IMPRESSION: No acute intracranial abnormality. 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 Background of mild parenchymal volume loss, microvascular angiopathy. Electronically Signed   By: Lovena Le M.D.   On: 10/26/2020 21:38    Procedures Procedures   Medications Ordered in ED Medications  amLODipine (NORVASC) tablet 5 mg (5 mg  Oral Given 10/26/20 2214)  prochlorperazine (COMPAZINE) injection 10 mg (10 mg Intravenous Given 10/26/20 2209)  diphenhydrAMINE (BENADRYL) injection 12.5 mg (12.5 mg Intravenous Given 10/26/20 2210)    ED Course  I have reviewed the triage vital signs and the nursing notes.  Pertinent labs & imaging results that were available during my care of the patient were reviewed by me and considered in my medical decision making (see chart for details).  Clinical Course as of 10/26/20 2335  Thu Oct 26, 2020  2305 Head CT without acute findings.  Remote lacunar infarcts noted [JK]  99991111 CBC metabolic panel normal.  Troponin normal [JK]    Clinical Course User Index [JK] Dorie Rank, MD   MDM Rules/Calculators/A&P                           Patient presented to the ED for evaluation of headache and hypertension.  No focal deficits noted on neuro exam.  Doubt stroke or TIA.  CT scan was performed and no signs of cerebral hemorrhage.  Patient treated with medications.  Feeling better.  Blood pressure is improving with treatment.  We will plan on discharge home with a prescription for Maxide which she had been taking previously.  Stressed the importance of following up with primary care doctor. Final Clinical Impression(s) / ED Diagnoses Final diagnoses:   Hypertension, unspecified type  Acute nonintractable headache, unspecified headache type    Rx / DC Orders ED Discharge Orders          Ordered    triamterene-hydrochlorothiazide (MAXZIDE-25) 37.5-25 MG tablet  Daily        10/26/20 2334             Dorie Rank, MD 10/26/20 2340

## 2020-10-26 NOTE — ED Provider Notes (Addendum)
[facility]   MRN: [id number] DOB: September 10, 1955  Subjective:   Olivia Gutierrez is a 65 y.o. female presenting for 3-day history of acute onset persistent intermittent posterior headache, frontal headache with associated lightheadedness and dizziness, photophobia.  No history of migraines.  Patient states that she has been using Tylenol without any relief.  Last dosing was an hour and a half ago.  She does have a history of high blood pressure, has been out of her medication for months now.  She has been checking her blood pressure readings at home and has been in the 123456 systolic.  Has not had regular follow-up with her PCP.  Denies confusion, weakness, numbness or tingling, chest pain, heart racing, diaphoresis, nausea, vomiting, abdominal pain.  She does have a family history of stroke.  No current facility-administered medications for this encounter.  Current Outpatient Medications:    triamterene-hydrochlorothiazide (MAXZIDE-25) 37.5-25 MG tablet, Take 1 tablet by mouth daily., Disp: 90 tablet, Rfl: 1   Allergies  Allergen Reactions   Other Shortness Of Breath and Itching    Certain peanuts.. Pecans, walnuts   Penicillins Hives and Rash    Has patient had a PCN reaction causing immediate rash, facial/tongue/throat swelling, SOB or lightheadedness with hypotension:unknown Has patient had a PCN reaction causing severe rash involving mucus membranes or skin necrosis: unknown Has patient had a PCN reaction that required hospitalization : yes Has patient had a PCN reaction occurring within the last 10 years: no, childhood allergy If all of the above answers are "NO", then may proceed with Cephalosporin use.     Past Medical History:  Diagnosis Date   Anemia    Arthritis    Asthma    Diverticulosis    GERD (gastroesophageal reflux disease)    Hyperlipidemia    Hypertension    Morbid obesity ([place])      Past Surgical History:  Procedure Laterality Date   none       Family History  Problem Relation Age of Onset   Heart attack Mother    Stroke Father    Lung disease Father    Heart failure Sister    Heart attack Sister    Colon cancer Brother    Cystic fibrosis Sister        x 4    Social History   Tobacco Use   Smoking status: Never   Smokeless tobacco: Never  Vaping Use   Vaping Use: Never used  Substance Use Topics   Alcohol use: No   Drug use: No    ROS   Objective:   Vitals: BP (!) 175/100 (BP Location: Left Arm)   Pulse 66   Temp 98 F (36.7 C) (Oral)   Resp 18   SpO2 97%   BP Readings from Last 3 Encounters:  10/26/20 (!) 175/100  11/10/19 (!) 157/94  05/20/19 139/87   Physical Exam Constitutional:      General: She is not in acute distress.    Appearance: Normal appearance. She is well-developed. She is not ill-appearing, toxic-appearing or diaphoretic.  HENT:     Head: Normocephalic and atraumatic.     Nose: Nose normal.     Mouth/Throat:     Mouth: Mucous membranes are moist.  Eyes:     Extraocular Movements: Extraocular movements intact.     Pupils: Pupils are equal, round, and reactive to light.  Cardiovascular:     Rate and Rhythm: Normal rate and regular rhythm.     Pulses: Normal  pulses.     Heart sounds: Normal heart sounds. No murmur heard.   No friction rub. No gallop.  Pulmonary:     Effort: Pulmonary effort is normal. No respiratory distress.     Breath sounds: Normal breath sounds. No stridor. No wheezing, rhonchi or rales.  Skin:    General: Skin is warm and dry.     Findings: No rash.  Neurological:     Mental Status: She is alert and oriented to person, place, and time.     Cranial Nerves: No cranial nerve deficit.     Motor: No weakness.     Coordination: Coordination normal.     Gait: Gait normal.     Deep Tendon Reflexes: Reflexes normal.     Comments: Negative Romberg and pronator drift.  Psychiatric:        Mood and Affect: Mood normal.        Behavior: Behavior normal.         Thought Content: Thought content normal.        Judgment: Judgment normal.    Assessment and Plan :   PDMP not reviewed this encounter.  1. Hypertensive urgency   2. Bad headache   3. Essential hypertension     Patient is in need of a higher level of evaluation than we can provide in the urgent care setting.  She has severely elevated blood pressure with a severe headache rated 8 out of 10, not responding to Tylenol.  Despite the negative Romberg and pronator drift, my concern is that she may be having an acute encephalopathy, stroke, aneurysm and therefore warrants head CT.  Discussed this with patient, verbalizes understanding and is in agreement.  Unfortunately there is no one that can transport her to the emergency room and therefore will send via EMS, nonemergent.   Jaynee Eagles, PA-C 10/26/20 1422   BP recheck was 152/93 on recheck at 15:14. Headache is still 6/10 despite taking Tylenol. Recommended patient go to the ER by personal vehicle as her blood pressure came down significantly. Still warrants further evaluation including head CT. Patient is in agreement, contracts for safety and will head there now.    Jaynee Eagles, PA-C 10/26/20 1517   After discussion with her family, patient does not want to go to the hospital by personal vehicle. EMS arrived, she is agreeable to transport by EMS.    Jaynee Eagles, PA-C 10/26/20 1520

## 2020-10-26 NOTE — ED Notes (Signed)
Notified [facility] [employer] about non-emergent transport need.

## 2020-10-26 NOTE — ED Notes (Signed)
UA and culture sent down to the lab.

## 2020-11-03 ENCOUNTER — Ambulatory Visit: Admission: EM | Admit: 2020-11-03 | Discharge: 2020-11-03 | Discharge status: Against Medical Advice | Payer: Self-pay

## 2020-11-03 ENCOUNTER — Encounter: Payer: Self-pay | Admitting: Emergency Medicine

## 2020-11-03 NOTE — ED Triage Notes (Signed)
Here for recurrent sciatic pain and hypertension. States the meds we prescribed her during her last visit aren't working for her BP or back pain

## 2020-11-03 NOTE — ED Notes (Signed)
Provided patient education about proper hypertension management through a PCP and the importance of that. Informed her about the importance of regular BP monitoring and proper at-home cuff calibration through her PCP.  Patient stated she wanted to leave to make it to the chiropractor's office before they closed. Left without being seen after triage

## 2020-11-03 NOTE — ED Provider Notes (Signed)
Patient left without being seen.   Jaynee Eagles, PA-C 11/03/20 1529

## 2020-11-14 ENCOUNTER — Emergency Department (HOSPITAL_COMMUNITY): Payer: No Typology Code available for payment source

## 2020-11-14 ENCOUNTER — Emergency Department (HOSPITAL_COMMUNITY)
Admission: EM | Admit: 2020-11-14 | Discharge: 2020-11-14 | Disposition: A | Discharge status: Home / Self Care | Payer: No Typology Code available for payment source | Attending: Emergency Medicine | Admitting: Emergency Medicine

## 2020-11-14 DIAGNOSIS — J45909 Unspecified asthma, uncomplicated: Secondary | ICD-10-CM | POA: Insufficient documentation

## 2020-11-14 DIAGNOSIS — I10 Essential (primary) hypertension: Secondary | ICD-10-CM | POA: Diagnosis not present

## 2020-11-14 DIAGNOSIS — K6289 Other specified diseases of anus and rectum: Secondary | ICD-10-CM | POA: Diagnosis not present

## 2020-11-14 DIAGNOSIS — R509 Fever, unspecified: Secondary | ICD-10-CM | POA: Insufficient documentation

## 2020-11-14 DIAGNOSIS — Z79899 Other long term (current) drug therapy: Secondary | ICD-10-CM | POA: Diagnosis not present

## 2020-11-14 DIAGNOSIS — R110 Nausea: Secondary | ICD-10-CM | POA: Diagnosis not present

## 2020-11-14 DIAGNOSIS — R109 Unspecified abdominal pain: Secondary | ICD-10-CM | POA: Insufficient documentation

## 2020-11-14 DIAGNOSIS — K5792 Diverticulitis of intestine, part unspecified, without perforation or abscess without bleeding: Secondary | ICD-10-CM

## 2020-11-14 LAB — URINALYSIS, ROUTINE W REFLEX MICROSCOPIC
Bilirubin Urine: NEGATIVE
Glucose, UA: NEGATIVE mg/dL
Hgb urine dipstick: NEGATIVE
Ketones, ur: NEGATIVE mg/dL
Leukocytes,Ua: NEGATIVE
Nitrite: NEGATIVE
Protein, ur: NEGATIVE mg/dL
Specific Gravity, Urine: 1.013 (ref 1.005–1.030)
pH: 8 (ref 5.0–8.0)

## 2020-11-14 LAB — COMPREHENSIVE METABOLIC PANEL
ALT: 12 U/L (ref 0–44)
AST: 15 U/L (ref 15–41)
Albumin: 3.1 g/dL — ABNORMAL LOW (ref 3.5–5.0)
Alkaline Phosphatase: 62 U/L (ref 38–126)
Anion gap: 9 (ref 5–15)
BUN: 11 mg/dL (ref 8–23)
CO2: 25 mmol/L (ref 22–32)
Calcium: 9.5 mg/dL (ref 8.9–10.3)
Chloride: 103 mmol/L (ref 98–111)
Creatinine, Ser: 0.89 mg/dL (ref 0.44–1.00)
GFR, Estimated: >60 mL/min (ref >60)
Glucose, Bld: 105 mg/dL — ABNORMAL HIGH (ref 70–99)
Potassium: 3.7 mmol/L (ref 3.5–5.1)
Sodium: 137 mmol/L (ref 135–145)
Total Bilirubin: 0.8 mg/dL (ref 0.3–1.2)
Total Protein: 6.2 g/dL — ABNORMAL LOW (ref 6.5–8.1)

## 2020-11-14 LAB — CBC WITH DIFFERENTIAL/PLATELET
Abs Immature Granulocytes: 0.03 10*3/uL (ref 0.00–0.07)
Basophils Absolute: 0 10*3/uL (ref 0.0–0.1)
Basophils Relative: 0 %
Eosinophils Absolute: 0.1 10*3/uL (ref 0.0–0.5)
Eosinophils Relative: 2 %
HCT: 37 % (ref 36.0–46.0)
Hemoglobin: 11.7 g/dL — ABNORMAL LOW (ref 12.0–15.0)
Immature Granulocytes: 0 %
Lymphocytes Relative: 14 %
Lymphs Abs: 1.1 10*3/uL (ref 0.7–4.0)
MCH: 27.8 pg (ref 26.0–34.0)
MCHC: 31.6 g/dL (ref 30.0–36.0)
MCV: 87.9 fL (ref 80.0–100.0)
Monocytes Absolute: 0.5 10*3/uL (ref 0.1–1.0)
Monocytes Relative: 6 %
Neutro Abs: 6.2 10*3/uL (ref 1.7–7.7)
Neutrophils Relative %: 78 %
Platelets: 298 10*3/uL (ref 150–400)
RBC: 4.21 MIL/uL (ref 3.87–5.11)
RDW: 13.6 % (ref 11.5–15.5)
WBC: 7.9 10*3/uL (ref 4.0–10.5)
nRBC: 0 % (ref 0.0–0.2)

## 2020-11-14 LAB — POC OCCULT BLOOD, ED: Fecal Occult Bld: NEGATIVE

## 2020-11-14 LAB — LIPASE, BLOOD: Lipase: 20 U/L (ref 11–51)

## 2020-11-14 MED ORDER — MORPHINE SULFATE 15 MG PO TABS: 7.5000 mg | ORAL_TABLET | ORAL | 0 refills | Status: DC | PRN

## 2020-11-14 MED ORDER — LIDOCAINE VISCOUS HCL 2 % MT SOLN
15.0000 mL | Freq: Once | OROMUCOSAL | Status: AC
  Administered 2020-11-14: 15 mL via ORAL
  Filled 2020-11-14: qty 15

## 2020-11-14 MED ORDER — KETOROLAC TROMETHAMINE 15 MG/ML IJ SOLN
15.0000 mg | Freq: Once | INTRAMUSCULAR | Status: AC
  Administered 2020-11-14: 15 mg via INTRAVENOUS
  Filled 2020-11-14: qty 1

## 2020-11-14 MED ORDER — METRONIDAZOLE 500 MG PO TABS: 500.0000 mg | ORAL_TABLET | Freq: Two times a day (BID) | ORAL | 0 refills | Status: DC

## 2020-11-14 MED ORDER — CIPROFLOXACIN HCL 500 MG PO TABS: 500.0000 mg | ORAL_TABLET | Freq: Two times a day (BID) | ORAL | 0 refills | Status: AC

## 2020-11-14 MED ORDER — CIPROFLOXACIN HCL 500 MG PO TABS: 500.0000 mg | ORAL_TABLET | Freq: Two times a day (BID) | ORAL | 0 refills | Status: DC

## 2020-11-14 MED ORDER — IOHEXOL 350 MG/ML SOLN
100.0000 mL | Freq: Once | INTRAVENOUS | Status: AC | PRN
  Administered 2020-11-14: 100 mL via INTRAVENOUS

## 2020-11-14 MED ORDER — CIPROFLOXACIN HCL 500 MG PO TABS
500.0000 mg | ORAL_TABLET | Freq: Once | ORAL | Status: AC
  Administered 2020-11-14: 500 mg via ORAL
  Filled 2020-11-14: qty 1

## 2020-11-14 MED ORDER — ALUM & MAG HYDROXIDE-SIMETH 200-200-20 MG/5ML PO SUSP
30.0000 mL | Freq: Once | ORAL | Status: AC
  Administered 2020-11-14: 30 mL via ORAL
  Filled 2020-11-14: qty 30

## 2020-11-14 MED ORDER — METRONIDAZOLE 500 MG PO TABS
500.0000 mg | ORAL_TABLET | Freq: Once | ORAL | Status: AC
  Administered 2020-11-14: 500 mg via ORAL
  Filled 2020-11-14: qty 1

## 2020-11-14 MED ORDER — METRONIDAZOLE 500 MG PO TABS: 500.0000 mg | ORAL_TABLET | Freq: Two times a day (BID) | ORAL | 0 refills | Status: AC

## 2020-11-14 NOTE — ED Provider Notes (Signed)
Received patient in signout from Dr. Karle Starch, briefly patient with concern for rectal mass, though not seen on exam, plan for CT. patient found to have sigmoid diverticulitis.  No signs of perforation or abscess.  No leukocytosis.  No significant vital sign abnormality.  I discussed the results with the patient and she is convinced that she needs to stay in the hospital.  I had a long discussion with her about inpatient versus outpatient treatment.  She also was concerned that she is constipated and nothing she takes over-the-counter seems to be working.  I counseled her on different home regimens.  We will give her GI follow-up.  Penicillin allergy so we will start her on Cipro and Flagyl.   Deno Etienne, DO 11/14/20 1843

## 2020-11-14 NOTE — ED Provider Notes (Signed)
[facility] EMERGENCY DEPARTMENT Provider Note  CSN: [id number] Arrival date & time: 11/14/20 [id number]    History Chief Complaint  Patient presents with   Rectal Pain    Olivia Gutierrez is a 65 y.o. female with history of intermittent constipation reports she has had difficulty passing stool recently. She had seen some blood in her stool a few days ago. She felt herself digitally and reports there was a mass in her rectum, did not feel stool. She has had some trouble urinating, nausea and subjective fever.    Past Medical History:  Diagnosis Date   Anemia    Arthritis    Asthma    Diverticulosis    GERD (gastroesophageal reflux disease)    Hyperlipidemia    Hypertension    Morbid obesity ([place])     Past Surgical History:  Procedure Laterality Date   none      Family History  Problem Relation Age of Onset   Heart attack Mother    Stroke Father    Lung disease Father    Heart failure Sister    Heart attack Sister    Colon cancer Brother    Cystic fibrosis Sister        x 4    Social History   Tobacco Use   Smoking status: Never   Smokeless tobacco: Never  Vaping Use   Vaping Use: Never used  Substance Use Topics   Alcohol use: No   Drug use: No     Home Medications Prior to Admission medications   Medication Sig Start Date End Date Taking? Authorizing Provider  albuterol (VENTOLIN HFA) 108 (90 Base) MCG/ACT inhaler Inhale 2 puffs into the lungs every 6 (six) hours as needed for wheezing or shortness of breath.   Yes [provider]  cholecalciferol (VITAMIN D3) 25 MCG (1000 UNIT) tablet Take 1,000 Units by mouth daily.   Yes [provider]  Multiple Vitamins-Minerals (MULTIVITAMIN ADULTS) TABS Take 1 tablet by mouth daily.   Yes [provider]  triamterene-hydrochlorothiazide (MAXZIDE-25) 37.5-25 MG tablet Take 1 tablet by mouth daily. 10/26/20  Yes Dorie Rank, MD  vitamin C (ASCORBIC ACID) 500 MG tablet Take 500 mg by  mouth daily.   Yes [provider]     Allergies    Other and Penicillins   Review of Systems   Review of Systems A comprehensive review of systems was completed and negative except as noted in HPI.    Physical Exam BP 113/76   Pulse 69   Temp 98.2 F (36.8 C) (Oral)   Resp 16   Ht '5\' 7"'$  (1.702 m)   Wt 127 kg   SpO2 100%   BMI 43.85 kg/m   Physical Exam Vitals and nursing note reviewed.  Constitutional:      Appearance: Normal appearance.  HENT:     Head: Normocephalic and atraumatic.     Nose: Nose normal.     Mouth/Throat:     Mouth: Mucous membranes are moist.  Eyes:     Extraocular Movements: Extraocular movements intact.     Conjunctiva/sclera: Conjunctivae normal.  Cardiovascular:     Rate and Rhythm: Normal rate.  Pulmonary:     Effort: Pulmonary effort is normal.     Breath sounds: Normal breath sounds.  Abdominal:     General: Abdomen is flat.     Palpations: Abdomen is soft. There is no mass.     Tenderness: There is abdominal tenderness (diffusely). There is no  guarding.  Genitourinary:    Comments: Chaperone present, no hemorrhoid or fissure. No palpable mass on rectal exam, stool soft and brown Musculoskeletal:        General: No swelling. Normal range of motion.     Cervical back: Neck supple.  Skin:    General: Skin is warm and dry.  Neurological:     General: No focal deficit present.     Mental Status: She is alert.  Psychiatric:        Mood and Affect: Mood normal.     ED Results / Procedures / Treatments   Labs (all labs ordered are listed, but only abnormal results are displayed) Labs Reviewed  CBC WITH DIFFERENTIAL/PLATELET - Abnormal; Notable for the following components:      Result Value   Hemoglobin 11.7 (*)    All other components within normal limits  COMPREHENSIVE METABOLIC PANEL - Abnormal; Notable for the following components:   Glucose, Bld 105 (*)    Total Protein 6.2 (*)    Albumin 3.1 (*)    All other  components within normal limits  LIPASE, BLOOD  URINALYSIS, ROUTINE W REFLEX MICROSCOPIC  POC OCCULT BLOOD, ED    EKG None  Radiology DG ABD ACUTE 2+V W 1V CHEST  Result Date: 11/14/2020 CLINICAL DATA:  Constipation EXAM: DG ABDOMEN ACUTE WITH 1 VIEW CHEST COMPARISON:  None. FINDINGS: There is no evidence of dilated bowel loops or free intraperitoneal air. Moderate stool burden. Top-normal heart size. Both lungs are clear. IMPRESSION: Unremarkable bowel gas pattern.  Moderate stool burden. Electronically Signed   By: Macy Mis M.D.   On: 11/14/2020 11:17    Procedures Procedures  Medications Ordered in the ED Medications  alum & mag hydroxide-simeth (MAALOX/MYLANTA) 200-200-20 MG/5ML suspension 30 mL (has no administration in time range)    And  lidocaine (XYLOCAINE) 2 % viscous mouth solution 15 mL (has no administration in time range)     MDM Rules/Calculators/A&P MDM Patient here with rectal pain and reported mass on DRE at home. I am unable to appreciate a mass on my exam but she is tender. Could be rectocele or less likely an early abscess. Will send for CT. Awaiting UA. CBC, CMP and lipase are unremarkable. Hemoccult neg.   ED Course  I have reviewed the triage vital signs and the nursing notes.  Pertinent labs & imaging results that were available during my care of the patient were reviewed by me and considered in my medical decision making (see chart for details).  Clinical Course as of 11/14/20 1616  Tue Nov 14, 2020  1610 Care of the patient signed out to Dr. Tyrone Nine at the change of shift pending CT.  [CS]    Clinical Course User Index [CS] Truddie Hidden, MD    Final Clinical Impression(s) / ED Diagnoses Final diagnoses:  None    Rx / DC Orders ED Discharge Orders     None        Truddie Hidden, MD 11/14/20 1616

## 2020-11-14 NOTE — ED Triage Notes (Addendum)
Pt here POV d/t rectal, vaginal and abdominal pain X1 week. Pain increased to being unable to tolerate. Pt states she feels there is a "mass" in her rectum and reports some bleeding from same area. Unable to have BM X 4 days Headache reported

## 2020-11-14 NOTE — Discharge Instructions (Signed)
Take 8 scoops of miralax in 32oz of whatever you would like to drink.(Gatorade comes in this size) You can also use a fleets enema which you can buy over the counter at the pharmacy.  Return for worsening abdominal pain, vomiting or fever. ? ?

## 2020-11-14 NOTE — ED Notes (Signed)
Patient transported to CT 

## 2020-11-14 NOTE — ED Provider Notes (Signed)
Emergency Medicine Provider Triage Evaluation Note  Olivia Gutierrez , a 65 y.o. female  was evaluated in triage.  Pt complains of constipation.  Review of Systems  Positive: Lower abd pain, rectal pressure, bloody bowel movement, nausea Negative: Dysuria, fever, cp, sob  Physical Exam  There were no vitals taken for this visit. Gen:   Awake, no distress   Resp:  Normal effort  MSK:   Moves extremities without difficulty  Other:  TTP suprapubic and LLQ  Medical Decision Making  Medically screening exam initiated at 10:15 AM.  Appropriate orders placed.  Olivia Gutierrez was informed that the remainder of the evaluation will be completed by another provider, this initial triage assessment does not replace that evaluation, and the importance of remaining in the ED until their evaluation is complete.  Hx of diverticular disease, report last week she notice some blood in her stool but for 1 week she has difficulty having BM.  Felt a lot of pressure about the rectum and when she performed her own digital rectal exam she felt a mass.  No hx of CA.  Having difficulty passing flatus.  Feels nauseated.    Domenic Moras, PA-C 11/14/20 1018    Truddie Hidden, MD 11/14/20 [phone]

## 2020-11-15 ENCOUNTER — Telehealth: Payer: Self-pay | Admitting: Gastroenterology

## 2020-11-15 NOTE — Telephone Encounter (Signed)
Patient stated that she has scheduled her appointment for 10/4 and does not need anything further

## 2020-11-15 NOTE — Telephone Encounter (Signed)
Patient called was recently seen at ED for Diverticulitis and she is seeking further advise.

## 2020-11-21 ENCOUNTER — Ambulatory Visit (INDEPENDENT_AMBULATORY_CARE_PROVIDER_SITE_OTHER): Payer: No Typology Code available for payment source | Admitting: Orthopaedic Surgery

## 2020-11-21 ENCOUNTER — Ambulatory Visit (INDEPENDENT_AMBULATORY_CARE_PROVIDER_SITE_OTHER): Payer: No Typology Code available for payment source

## 2020-11-21 ENCOUNTER — Encounter: Payer: Self-pay | Admitting: Orthopaedic Surgery

## 2020-11-21 ENCOUNTER — Other Ambulatory Visit: Payer: Self-pay

## 2020-11-21 VITALS — Ht 67.0 in | Wt 279.0 lb

## 2020-11-21 DIAGNOSIS — M545 Low back pain, unspecified: Secondary | ICD-10-CM

## 2020-11-21 MED ORDER — METHOCARBAMOL 500 MG PO TABS: 500.0000 mg | ORAL_TABLET | Freq: Three times a day (TID) | ORAL | 0 refills | Status: DC | PRN

## 2020-11-21 MED ORDER — PREDNISONE 10 MG (21) PO TBPK: ORAL_TABLET | ORAL | 0 refills | Status: DC

## 2020-11-21 NOTE — Progress Notes (Signed)
Office Visit Note   Patient: Olivia Gutierrez           Date of Birth: 1955-11-03           MRN: [id number] Visit Date: 11/21/2020              Requested by: Benito Mccreedy, MD [id number] [place],  [place] [address] PCP: Benito Mccreedy, MD   Assessment & Plan: Visit Diagnoses:  1. Acute bilateral low back pain, unspecified whether sciatica present     Plan: Impression is right sided lumbar radiculopathy.  At this point, we have discussed starting her on a steroid taper and muscle relaxer for which she is agreeable to.  If her symptoms fail to improve over the next few weeks we will order an MRI of her lumbar spine.  Otherwise, follow-up with [place] as needed.  Follow-Up Instructions: Return if symptoms worsen or fail to improve.   Orders:  Orders Placed This Encounter  Procedures   XR Lumbar Spine 2-3 Views   Meds ordered this encounter  Medications   predniSONE (STERAPRED UNI-PAK 21 TAB) 10 MG (21) TBPK tablet    Sig: Take as directed    Dispense:  21 tablet    Refill:  0   methocarbamol (ROBAXIN) 500 MG tablet    Sig: Take 1 tablet (500 mg total) by mouth 3 (three) times daily as needed.    Dispense:  30 tablet    Refill:  0      Procedures: No procedures performed   Clinical Data: No additional findings.   Subjective: Chief Complaint  Patient presents with   Lower Back - Pain    HPI patient is a pleasant 65 year old female who comes in today with right lower back and buttock pain in addition to muscle spasms for the past several weeks.  She notes that about 6 weeks ago, she fell while at work.  His only thing that she contribute to the pain although the pain did not start right away.  The pain is worse getting in and out of the car as well as when she is sitting down.  She denies any paresthesias to the right lower extremity.  She has been taking Tylenol as needed.  No weakness to the right lower extremity.  No bowel or bladder change  or saddle paresthesias.  Review of Systems HPI.  All others reviewed and are negative.   Objective: Vital Signs: Ht '5\' 7"'$  (1.702 m)   Wt 279 lb (126.6 kg)   BMI 43.70 kg/m   Physical Exam well-developed well-nourished female in no acute distress.  Alert and oriented x3.  Ortho Exam lumbar spine exam shows no spinous or paraspinous tenderness.  No lateral hip tenderness.  Negative logroll negative FADIR.  Positive straight leg raise.  She is neurovascular intact distally.  No focal weakness.  Specialty Comments:  No specialty comments available.  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    PMFS History: Patient Active Problem List   Diagnosis Date Noted   SOB (shortness of breath) 09/29/2014   Essential hypertension 09/29/2014   Chronic anemia 09/29/2014   Stricture and stenosis of esophagus 11/19/2013   Special screening for malignant neoplasms, colon 10/20/2013   Esophageal reflux 10/20/2013   Dysphagia 10/20/2013   Past Medical History:  Diagnosis Date   Anemia    Arthritis    Asthma    Diverticulosis  GERD (gastroesophageal reflux disease)    Hyperlipidemia    Hypertension    Morbid obesity ([place])     Family History  Problem Relation Age of Onset   Heart attack Mother    Stroke Father    Lung disease Father    Heart failure Sister    Heart attack Sister    Colon cancer Brother    Cystic fibrosis Sister        x 4    Past Surgical History:  Procedure Laterality Date   none     Social History   Occupational History   Occupation: Wellsite geologist  Tobacco Use   Smoking status: Never   Smokeless tobacco: Never  Vaping Use   Vaping Use: Never used  Substance and Sexual Activity   Alcohol use: No   Drug use: No   Sexual activity: Yes    Birth control/protection: Post-menopausal

## 2020-12-19 ENCOUNTER — Encounter: Payer: Self-pay | Admitting: Physician Assistant

## 2020-12-19 ENCOUNTER — Ambulatory Visit (INDEPENDENT_AMBULATORY_CARE_PROVIDER_SITE_OTHER): Payer: No Typology Code available for payment source | Admitting: Physician Assistant

## 2020-12-19 VITALS — BP 162/100 | HR 74 | Ht 67.5 in | Wt 253.0 lb

## 2020-12-19 DIAGNOSIS — K219 Gastro-esophageal reflux disease without esophagitis: Secondary | ICD-10-CM

## 2020-12-19 DIAGNOSIS — R1310 Dysphagia, unspecified: Secondary | ICD-10-CM

## 2020-12-19 DIAGNOSIS — Z8719 Personal history of other diseases of the digestive system: Secondary | ICD-10-CM

## 2020-12-19 DIAGNOSIS — K5900 Constipation, unspecified: Secondary | ICD-10-CM | POA: Diagnosis not present

## 2020-12-19 MED ORDER — OMEPRAZOLE 20 MG PO CPDR: 20.0000 mg | DELAYED_RELEASE_CAPSULE | Freq: Every day | ORAL | 11 refills | Status: DC

## 2020-12-19 MED ORDER — NA SULFATE-K SULFATE-MG SULF 17.5-3.13-1.6 GM/177ML PO SOLN: 1.0000 | Freq: Once | ORAL | 0 refills | Status: AC

## 2020-12-19 NOTE — Progress Notes (Signed)
Reviewed and agree with management plan.  Tawyna Pellot T. Renwick Asman, MD FACG 

## 2020-12-19 NOTE — Patient Instructions (Signed)
We have sent the following medications to your pharmacy for you to pick up at your convenience: Omeprazole 20 mg daily 30-60 minutes before breakfast.   You have been scheduled for an endoscopy and colonoscopy. Please follow the written instructions given to you at your visit today. Please pick up your prep supplies at the pharmacy within the next 1-3 days. If you use inhalers (even only as needed), please bring them with you on the day of your procedure.  If you are age 64 or older, your body mass index should be between 23-30. Your Body mass index is 39.04 kg/m. If this is out of the aforementioned range listed, please consider follow up with your Primary Care Provider.  If you are age 45 or younger, your body mass index should be between 19-25. Your Body mass index is 39.04 kg/m. If this is out of the aformentioned range listed, please consider follow up with your Primary Care Provider.   __________________________________________________________  The [facility] providers would like to encourage you to use [facility] to communicate with providers for non-urgent requests or questions.  Due to long hold times on the telephone, sending your provider a message by [facility] may be a faster and more efficient way to get a response.  Please allow 48 business hours for a response.  Please remember that this is for non-urgent requests.

## 2020-12-19 NOTE — Progress Notes (Signed)
Chief Complaint: Diverticulitis, GERD, dysphagia  HPI:    Mrs. Olivia Gutierrez is a 65 year old African-American female, known to Dr. Fuller Plan, with a past medical history of diverticulosis, reflux and obesity, who presents to clinic today as a referral from the ER Dr. Calvert Cantor after being diagnosed with diverticulitis, GERD and dysphagia.    11/19/2013 EGD with LA class D esophagitis, stricture which was dilated and hiatal hernia.  Was recommended that she have a colonoscopy with 2-day bowel prep once her reflux had settled down.    11/14/2020 patient seen in the ER for rectal bleeding and intermittent constipation, x-ray showed unremarkable bowel gas pattern and moderate stool burden, had an abdominal CT with marked severity sigmoid diverticulitis.  At that time patient was started on ciprofloxacin and Flagyl.  CBC at that time hemoglobin 11.7 and otherwise normal CMP and lipase and negative fecal occult blood.    Today, the patient tells me that she finished antibiotics for her diverticulitis but did develop a rash/hives all over, but she finished them.  Tells me she thinks they were working because "now my stools passing through".  Does tell me though that she tends to be constipated and will use milk of magnesia but does not like this because it feels like it "depletes me of all my minerals".  Explains that she was previously using MiraLAX but ran out of this.  Denies seeing any blood in her stool any longer.  Does tell me that she feels "tissue" up in her rectum.    Also discusses daily reflux for which she chews Rolaids constantly.  Along with this is having some trouble with swallowing, tells me that she had to be "stretched before".    Vague history of a sister with possible stomach cancer.  Patient is worried that she has cancer now too.    Denies fever, chills, weight loss, abdominal pain or symptoms that awaken her from sleep.  Past Medical History:  Diagnosis Date   Anemia     Arthritis    Asthma    Diverticulosis    GERD (gastroesophageal reflux disease)    Hyperlipidemia    Hypertension    Morbid obesity ([place])     Past Surgical History:  Procedure Laterality Date   none      Current Outpatient Medications  Medication Sig Dispense Refill   albuterol (VENTOLIN HFA) 108 (90 Base) MCG/ACT inhaler Inhale 2 puffs into the lungs every 6 (six) hours as needed for wheezing or shortness of breath.     cholecalciferol (VITAMIN D3) 25 MCG (1000 UNIT) tablet Take 1,000 Units by mouth daily.     methocarbamol (ROBAXIN) 500 MG tablet Take 1 tablet (500 mg total) by mouth 3 (three) times daily as needed. 30 tablet 0   morphine (MSIR) 15 MG tablet Take 0.5 tablets (7.5 mg total) by mouth every 4 (four) hours as needed for severe pain. 5 tablet 0   Multiple Vitamins-Minerals (MULTIVITAMIN ADULTS) TABS Take 1 tablet by mouth daily.     predniSONE (STERAPRED UNI-PAK 21 TAB) 10 MG (21) TBPK tablet Take as directed 21 tablet 0   triamterene-hydrochlorothiazide (MAXZIDE-25) 37.5-25 MG tablet Take 1 tablet by mouth daily. 90 tablet 1   vitamin C (ASCORBIC ACID) 500 MG tablet Take 500 mg by mouth daily.     No current facility-administered medications for this visit.    Allergies as of 12/19/2020 - Review Complete 12/19/2020  Allergen Reaction Noted   Other Shortness Of Breath and Itching 09/29/2014  Penicillins Hives and Rash 01/23/2011    Family History  Problem Relation Age of Onset   Heart attack Mother    Stroke Father    Lung disease Father    Heart failure Sister    Heart attack Sister    Colon cancer Brother    Cystic fibrosis Sister        x 4    Social History   Socioeconomic History   Marital status: Married    Spouse name: Not on file   Number of children: 0   Years of education: Not on file   Highest education level: Not on file  Occupational History   Occupation: Wellsite geologist  Tobacco Use   Smoking status: Never   Smokeless  tobacco: Never  Vaping Use   Vaping Use: Never used  Substance and Sexual Activity   Alcohol use: No   Drug use: No   Sexual activity: Yes    Birth control/protection: Post-menopausal  Other Topics Concern   Not on file  Social History Narrative   Not on file   Social Determinants of Health   Financial Resource Strain: Not on file  Food Insecurity: Not on file  Transportation Needs: Not on file  Physical Activity: Not on file  Stress: Not on file  Social Connections: Not on file  Intimate Partner Violence: Not on file    Review of Systems:    Constitutional: No weight loss, fever or chills Skin: No rash  Cardiovascular: No chest pain Respiratory: No SOB Gastrointestinal: See HPI and otherwise negative Genitourinary: No dysuria  Neurological: No headache, dizziness or syncope Musculoskeletal: No new muscle or joint pain Hematologic: No bruising Psychiatric: No history of depression or anxiety   Physical Exam:  Vital signs: BP (!) 162/100   Pulse 74   Ht 5' 7.5" (1.715 m)   Wt 253 lb (114.8 kg)   BMI 39.04 kg/m    Constitutional:   Pleasant overweight AA female appears to be in NAD, Well developed, Well nourished, alert and cooperative Head:  Normocephalic and atraumatic. Eyes:   PEERL, EOMI. No icterus. Conjunctiva pink. Ears:  Normal auditory acuity. Neck:  Supple Throat: Oral cavity and pharynx without inflammation, swelling or lesion.  Respiratory: Respirations even and unlabored. Lungs clear to auscultation bilaterally.   No wheezes, crackles, or rhonchi.  Cardiovascular: Normal S1, S2. No MRG. Regular rate and rhythm. No peripheral edema, cyanosis or pallor.  Gastrointestinal:  Soft, nondistended, nontender. No rebound or guarding. Normal bowel sounds. No appreciable masses or hepatomegaly. Rectal:  Not performed.  Msk:  Symmetrical without gross deformities. Without edema, no deformity or joint abnormality.  Neurologic:  Alert and  oriented x4;  grossly  normal neurologically.  Skin:   Dry and intact without significant lesions or rashes. Psychiatric: Demonstrates good judgement and reason without abnormal affect or behaviors.  RELEVANT LABS AND IMAGING: CBC    Component Value Date/Time   WBC 7.9 11/14/2020 1015   RBC 4.21 11/14/2020 1015   HGB 11.7 (L) 11/14/2020 1015   HGB 12.0 05/20/2019 1120   HCT 37.0 11/14/2020 1015   HCT 37.3 05/20/2019 1120   PLT 298 11/14/2020 1015   PLT 307 05/20/2019 1120   MCV 87.9 11/14/2020 1015   MCV 87 05/20/2019 1120   MCH 27.8 11/14/2020 1015   MCHC 31.6 11/14/2020 1015   RDW 13.6 11/14/2020 1015   RDW 13.8 05/20/2019 1120   LYMPHSABS 1.1 11/14/2020 1015   LYMPHSABS 1.1 05/20/2019 1120  MONOABS 0.5 11/14/2020 1015   EOSABS 0.1 11/14/2020 1015   EOSABS 0.3 05/20/2019 1120   BASOSABS 0.0 11/14/2020 1015   BASOSABS 0.0 05/20/2019 1120    CMP     Component Value Date/Time   NA 137 11/14/2020 1015   NA 141 05/20/2019 1120   K 3.7 11/14/2020 1015   CL 103 11/14/2020 1015   CO2 25 11/14/2020 1015   GLUCOSE 105 (H) 11/14/2020 1015   BUN 11 11/14/2020 1015   BUN 15 05/20/2019 1120   CREATININE 0.89 11/14/2020 1015   CALCIUM 9.5 11/14/2020 1015   PROT 6.2 (L) 11/14/2020 1015   PROT 6.2 05/20/2019 1120   ALBUMIN 3.1 (L) 11/14/2020 1015   ALBUMIN 4.0 05/20/2019 1120   AST 15 11/14/2020 1015   ALT 12 11/14/2020 1015   ALKPHOS 62 11/14/2020 1015   BILITOT 0.8 11/14/2020 1015   BILITOT <0.2 05/20/2019 1120   GFRNONAA >60 11/14/2020 1015   GFRAA 96 05/20/2019 1120    Assessment: 1.  History of diverticulitis: Seen in the ER recently with Cipro/Flagyl treatment and resolution of pain 2.  Constipation: Sounds chronic for the patient, worse when she had diverticulitis 3.  GERD: Daily symptoms; likely element of gastritis 4.  Dysphagia: History of esophageal stricture last dilated in 2015; likely stricture  Plan: 1.  Scheduled patient for an EGD with dilation and colonoscopy in the [place]  with Dr. Fuller Plan.  Did provide the patient a detailed list of risks for the procedure and she agrees to proceed.  We will wait to schedule these until after October 25 which will be 6 weeks out from finishing her antibiotics for diverticulitis. Patient is appropriate for endoscopic procedure(s) in the ambulatory ([place]) setting.  2.  Encouraged the patient to continue a high-fiber diet with at least 6-eight 8 ounce glasses of water a day 3.  Discussed dysphagia and anti-dysphagia measures as well as antireflux measures. 4.  Started the patient on Omeprazole 20 mg once daily #30 with 5 refills.  Patient is concerned about being on this medication because she has heard bad things about it in the news.  Hence why we have started her at such a low dose. 5.  Encouraged the patient to use MiraLAX up to 4 times a day to maintain regular bowel movements. 6.  Patient will have a 2-day bowel prep given history of constipation. 7.  Patient to follow in clinic per recommendations from Dr. Fuller Plan after time of procedures  Ellouise Newer, PA-C [facility] 12/19/2020, 9:43 AM  Cc: Benito Mccreedy, MD

## 2021-01-23 ENCOUNTER — Telehealth: Payer: Self-pay | Admitting: Physician Assistant

## 2021-01-23 NOTE — Telephone Encounter (Signed)
Proceed with prep as outlined.

## 2021-01-23 NOTE — Telephone Encounter (Signed)
Patient is calling to go over prep instructions is scheduled for 01/24/21.

## 2021-01-23 NOTE — Telephone Encounter (Signed)
PT called to clarify her prep instructions for her procedure tomorrow afternoon. She did not start with the miralax and dulcolax yesterday as instructed per her 2 day prep instructions. She did however start her Suprep this morning. Will check with Dr. Fuller Plan to see if she should take the miralax this evening since she started with the Suprep this morning and would not take the second half of the Suprep until tomorrow at 10:30 am.

## 2021-01-24 ENCOUNTER — Ambulatory Visit (AMBULATORY_SURGERY_CENTER): Payer: No Typology Code available for payment source | Admitting: Gastroenterology

## 2021-01-24 ENCOUNTER — Encounter: Payer: Self-pay | Admitting: Gastroenterology

## 2021-01-24 VITALS — BP 111/87 | HR 58 | Temp 98.5°F | Resp 14 | Ht 67.5 in | Wt 253.0 lb

## 2021-01-24 DIAGNOSIS — K2100 Gastro-esophageal reflux disease with esophagitis, without bleeding: Secondary | ICD-10-CM | POA: Diagnosis not present

## 2021-01-24 DIAGNOSIS — K222 Esophageal obstruction: Secondary | ICD-10-CM | POA: Diagnosis not present

## 2021-01-24 DIAGNOSIS — K648 Other hemorrhoids: Secondary | ICD-10-CM | POA: Diagnosis not present

## 2021-01-24 DIAGNOSIS — D122 Benign neoplasm of ascending colon: Secondary | ICD-10-CM

## 2021-01-24 DIAGNOSIS — Z800 Family history of malignant neoplasm of digestive organs: Secondary | ICD-10-CM | POA: Diagnosis not present

## 2021-01-24 DIAGNOSIS — R933 Abnormal findings on diagnostic imaging of other parts of digestive tract: Secondary | ICD-10-CM

## 2021-01-24 DIAGNOSIS — R1310 Dysphagia, unspecified: Secondary | ICD-10-CM | POA: Diagnosis not present

## 2021-01-24 DIAGNOSIS — K449 Diaphragmatic hernia without obstruction or gangrene: Secondary | ICD-10-CM | POA: Diagnosis not present

## 2021-01-24 DIAGNOSIS — K5730 Diverticulosis of large intestine without perforation or abscess without bleeding: Secondary | ICD-10-CM

## 2021-01-24 MED ORDER — OMEPRAZOLE 40 MG PO CPDR: 40.0000 mg | DELAYED_RELEASE_CAPSULE | Freq: Two times a day (BID) | ORAL | 7 refills | Status: AC

## 2021-01-24 MED ORDER — SODIUM CHLORIDE 0.9 % IV SOLN
500.0000 mL | Freq: Once | INTRAVENOUS | Status: DC
Start: 2021-01-24 — End: 2021-01-24

## 2021-01-24 NOTE — Patient Instructions (Signed)
YOU HAD AN ENDOSCOPIC PROCEDURE TODAY AT THE [facility]:   Refer to the procedure report that was given to you for any specific questions about what was found during the examination.  If the procedure report does not answer your questions, please call your gastroenterologist to clarify.  If you requested that your care partner not be given the details of your procedure findings, then the procedure report has been included in a sealed envelope for you to review at your convenience later.  YOU SHOULD EXPECT: Some feelings of bloating in the abdomen. Passage of more gas than usual.  Walking can help get rid of the air that was put into your GI tract during the procedure and reduce the bloating. If you had a lower endoscopy (such as a colonoscopy or flexible sigmoidoscopy) you may notice spotting of blood in your stool or on the toilet paper. If you underwent a bowel prep for your procedure, you may not have a normal bowel movement for a few days.  Please Note:  You might notice some irritation and congestion in your nose or some drainage.  This is from the oxygen used during your procedure.  There is no need for concern and it should clear up in a day or so.  SYMPTOMS TO REPORT IMMEDIATELY:   Following lower endoscopy (colonoscopy or flexible sigmoidoscopy):  Excessive amounts of blood in the stool  Significant tenderness or worsening of abdominal pains  Swelling of the abdomen that is new, acute  Fever of 100F or higher   Following upper endoscopy (EGD)  Vomiting of blood or coffee ground material  New chest pain or pain under the shoulder blades  Painful or persistently difficult swallowing  New shortness of breath  Fever of 100F or higher  Black, tarry-looking stools  For urgent or emergent issues, a gastroenterologist can be reached at any hour by calling [phone]. Do not use MyChart messaging for urgent concerns.    DIET:  We do recommend a small meal at first, but  then you may proceed to your regular diet.  Drink plenty of fluids but you should avoid alcoholic beverages for 24 hours.  ACTIVITY:  You should plan to take it easy for the rest of today and you should NOT DRIVE or use heavy machinery until tomorrow (because of the sedation medicines used during the test).    FOLLOW UP: Our staff will call the number listed on your records 48-72 hours following your procedure to check on you and address any questions or concerns that you may have regarding the information given to you following your procedure. If we do not reach you, we will leave a message.  We will attempt to reach you two times.  During this call, we will ask if you have developed any symptoms of COVID 19. If you develop any symptoms (ie: fever, flu-like symptoms, shortness of breath, cough etc.) before then, please call [phone].  If you test positive for Covid 19 in the 2 weeks post procedure, please call and report this information to us.    If any biopsies were taken you will be contacted by phone or by letter within the next 1-3 weeks.  Please call us at [phone] if you have not heard about the biopsies in 3 weeks.    SIGNATURES/CONFIDENTIALITY: You and/or your care partner have signed paperwork which will be entered into your electronic medical record.  These signatures attest to the fact that that the information above on   your After Visit Summary has been reviewed and is understood.  Full responsibility of the confidentiality of this discharge information lies with you and/or your care-partner. 

## 2021-01-24 NOTE — Progress Notes (Signed)
History & Physical  Primary Care Physician:  Benito Mccreedy, MD Primary Gastroenterologist: Lucio Edward, MD  CHIEF COMPLAINT: Recent diverticulitis, abnormal CT of the colon, family history of colon cancer, dysphagia  HPI: Olivia Gutierrez is a 65 y.o. female with a recent history of diverticulitis, abnormal CT of the sigmoid colon, family history of colon cancer, solid food dysphagia, history of esophageal stricture, and history of grade D erosive esophagitis for colonoscopy and EGD.   Past Medical History:  Diagnosis Date   Anemia    Arthritis    Asthma    Diverticulosis    GERD (gastroesophageal reflux disease)    Hyperlipidemia    Hypertension    Morbid obesity ([place])     Past Surgical History:  Procedure Laterality Date   WISDOM TOOTH EXTRACTION      Prior to Admission medications   Medication Sig Start Date End Date Taking? Authorizing Provider  cholecalciferol (VITAMIN D3) 25 MCG (1000 UNIT) tablet Take 1,000 Units by mouth daily.   Yes [provider]  omeprazole (PRILOSEC) 20 MG capsule Take 1 capsule (20 mg total) by mouth daily. 12/19/20  Yes Levin Erp, PA  triamterene-hydrochlorothiazide (MAXZIDE-25) 37.5-25 MG tablet Take 1 tablet by mouth daily. 10/26/20  Yes Dorie Rank, MD  albuterol (VENTOLIN HFA) 108 (90 Base) MCG/ACT inhaler Inhale 2 puffs into the lungs every 6 (six) hours as needed for wheezing or shortness of breath.    [provider]  Multiple Vitamins-Minerals (MULTIVITAMIN ADULTS) TABS Take 1 tablet by mouth daily.    [provider]  vitamin C (ASCORBIC ACID) 500 MG tablet Take 500 mg by mouth daily.    [provider]    Current Outpatient Medications  Medication Sig Dispense Refill   cholecalciferol (VITAMIN D3) 25 MCG (1000 UNIT) tablet Take 1,000 Units by mouth daily.     omeprazole (PRILOSEC) 20 MG capsule Take 1 capsule (20 mg total) by mouth daily. 30 capsule 11    triamterene-hydrochlorothiazide (MAXZIDE-25) 37.5-25 MG tablet Take 1 tablet by mouth daily. 90 tablet 1   albuterol (VENTOLIN HFA) 108 (90 Base) MCG/ACT inhaler Inhale 2 puffs into the lungs every 6 (six) hours as needed for wheezing or shortness of breath.     Multiple Vitamins-Minerals (MULTIVITAMIN ADULTS) TABS Take 1 tablet by mouth daily.     vitamin C (ASCORBIC ACID) 500 MG tablet Take 500 mg by mouth daily.     Current Facility-Administered Medications  Medication Dose Route Frequency Provider Last Rate Last Admin   0.9 %  sodium chloride infusion  500 mL Intravenous Once Ladene Artist, MD        Allergies as of 01/24/2021 - Review Complete 01/24/2021  Allergen Reaction Noted   Other Shortness Of Breath and Itching 09/29/2014   Penicillins Hives and Rash 01/23/2011    Family History  Problem Relation Age of Onset   Heart attack Mother    Stroke Father    Lung disease Father    Heart failure Sister    Heart attack Sister    Cystic fibrosis Sister        x 4   Colon cancer Sister    Colon cancer Brother    Stomach cancer Neg Hx    Pancreatic cancer Neg Hx    Esophageal cancer Neg Hx     Social History   Socioeconomic History   Marital status: Married    Spouse name: Not on file   Number of children: 0  Years of education: Not on file   Highest education level: Not on file  Occupational History   Occupation: Wellsite geologist  Tobacco Use   Smoking status: Never   Smokeless tobacco: Never  Vaping Use   Vaping Use: Never used  Substance and Sexual Activity   Alcohol use: Yes    Comment: occ glass of wine   Drug use: No   Sexual activity: Yes    Birth control/protection: Post-menopausal  Other Topics Concern   Not on file  Social History Narrative   Not on file   Social Determinants of Health   Financial Resource Strain: Not on file  Food Insecurity: Not on file  Transportation Needs: Not on file  Physical Activity: Not on file  Stress:  Not on file  Social Connections: Not on file  Intimate Partner Violence: Not on file    Review of Systems:  All systems reviewed an negative except where noted in HPI.  Gen: Denies any fever, chills, sweats, anorexia, fatigue, weakness, malaise, weight loss, and sleep disorder CV: Denies chest pain, angina, palpitations, syncope, orthopnea, PND, peripheral edema, and claudication. Resp: Denies dyspnea at rest, dyspnea with exercise, cough, sputum, wheezing, coughing up blood, and pleurisy. GI: Denies vomiting blood, jaundice, and fecal incontinence.   Denies dysphagia or odynophagia. GU : Denies urinary burning, blood in urine, urinary frequency, urinary hesitancy, nocturnal urination, and urinary incontinence. MS: Denies joint pain, limitation of movement, and swelling, stiffness, low back pain, extremity pain. Denies muscle weakness, cramps, atrophy.  Derm: Denies rash, itching, dry skin, hives, moles, warts, or unhealing ulcers.  Psych: Denies depression, anxiety, memory loss, suicidal ideation, hallucinations, paranoia, and confusion. Heme: Denies bruising, bleeding, and enlarged lymph nodes. Neuro:  Denies any headaches, dizziness, paresthesias. Endo:  Denies any problems with DM, thyroid, adrenal function.   Physical Exam: General:  Alert, well-developed, in NAD Head:  Normocephalic and atraumatic. Eyes:  Sclera clear, no icterus.   Conjunctiva pink. Ears:  Normal auditory acuity. Mouth:  No deformity or lesions.  Neck:  Supple; no masses . Lungs:  Clear throughout to auscultation.   No wheezes, crackles, or rhonchi. No acute distress. Heart:  Regular rate and rhythm; no murmurs. Abdomen:  Soft, nondistended, nontender. No masses, hepatomegaly. No obvious masses.  Normal bowel .    Rectal:  Deferred   Msk:  Symmetrical without gross deformities.. Pulses:  Normal pulses noted. Extremities:  Without edema. Neurologic:  Alert and  oriented x4;  grossly normal  neurologically. Skin:  Intact without significant lesions or rashes. Cervical Nodes:  No significant cervical adenopathy. Psych:  Alert and cooperative. Normal mood and affect.   Impression / Plan:   Recent diverticulitis, abnormal CT of the colon, family history of colon cancer, dysphagia for colonoscopy and EGD with possible dilation.    Pricilla Riffle. Fuller Plan  01/24/2021, 3:29 PM See Shea Evans, [facility], to contact our on call provider

## 2021-01-24 NOTE — Progress Notes (Signed)
1525 Robinul 0.1 mg IV given due large amount of secretions upon assessment.  MD made aware, vss

## 2021-01-24 NOTE — Op Note (Signed)
[facility] Patient Name: Olivia Gutierrez Procedure Date: 01/24/2021 3:25 PM MRN: [id number] Endoscopist: Ladene Artist , MD Age: 65 Referring MD:  Date of Birth: 1955/11/26 Gender: Female Account #: [id number] Procedure:                Upper GI endoscopy Indications:              Dysphagia Medicines:                Monitored Anesthesia Care Procedure:                Pre-Anesthesia Assessment:                           - Prior to the procedure, a History and Physical                            was performed, and patient medications and                            allergies were reviewed. The patient's tolerance of                            previous anesthesia was also reviewed. The risks                            and benefits of the procedure and the sedation                            options and risks were discussed with the patient.                            All questions were answered, and informed consent                            was obtained. Prior Anticoagulants: The patient has                            taken no previous anticoagulant or antiplatelet                            agents. ASA Grade Assessment: III - A patient with                            severe systemic disease. After reviewing the risks                            and benefits, the patient was deemed in                            satisfactory condition to undergo the procedure.                           After obtaining informed consent, the endoscope was  passed under direct vision. Throughout the                            procedure, the patient's blood pressure, pulse, and                            oxygen saturations were monitored continuously. The                            GIF [id number] #[id number] was introduced through the                            mouth, and advanced to the second part of duodenum.                            The upper GI endoscopy was  accomplished without                            difficulty. The patient tolerated the procedure                            well. Scope In: Scope Out: Findings:                 LA Grade D (one or more mucosal breaks involving at                            least 75% of esophageal circumference) esophagitis                            with no bleeding was found in the distal esophagus.                           One benign-appearing, intrinsic moderate stenosis                            was found at the gastroesophageal junction. This                            stenosis measured 1.1 cm (inner diameter) x less                            than one cm (in length). The stenosis was                            traversed. A guidewire was placed and the scope was                            withdrawn. Dilation was performed with a Savary                            dilator with mild resistance at 13 mm, 14 mm and 15  mm. Trace heme on the last dilator.                           The exam of the esophagus was otherwise normal.                           A medium-sized hiatal hernia (5cm) was present.                           The exam of the stomach was otherwise normal.                           The duodenal bulb and second portion of the                            duodenum were normal. Complications:            No immediate complications. Estimated Blood Loss:     Estimated blood loss was minimal. Impression:               - LA Grade D reflux esophagitis with no bleeding.                           - Benign-appearing esophageal stenosis. Dilated.                           - Medium-sized hiatal hernia.                           - Normal duodenal bulb and second portion of the                            duodenum.                           - No specimens collected. Recommendation:           - Patient has a contact number available for                            emergencies. The  signs and symptoms of potential                            delayed complications were discussed with the                            patient. Return to normal activities tomorrow.                            Written discharge instructions were provided to the                            patient.                           - Clear liquid diet for 2 hours, then advance as  tolerated to soft diet today.                           - Resume prior diet tomorrow.                           - Follow antireflux measures long term.                           - Continue present medications.                           - Change omeprazole to 40 mg po bid, 1 year of                            refills.                           - GI office appt with me or Ellouise Newer, PA in                            2 months. Consider repeat EGD to assess esophagitis                            healing, repeat dilation as indicated. Ladene Artist, MD 01/24/2021 4:22:16 PM This report has been signed electronically.

## 2021-01-24 NOTE — Progress Notes (Signed)
Report given to PACU, vss 

## 2021-01-24 NOTE — Op Note (Signed)
[facility] Patient Name: Olivia Gutierrez Procedure Date: 01/24/2021 3:26 PM MRN: [id number] Endoscopist: Ladene Artist , MD Age: 65 Referring MD:  Date of Birth: 02-01-1956 Gender: Female Account #: [id number] Procedure:                Colonoscopy Indications:              Abnormal CT of the GI tract (colon), Recent                            diverticulitis, Family history of colon cancer, 2                            first degree relatives. Medicines:                Monitored Anesthesia Care Procedure:                Pre-Anesthesia Assessment:                           - Prior to the procedure, a History and Physical                            was performed, and patient medications and                            allergies were reviewed. The patient's tolerance of                            previous anesthesia was also reviewed. The risks                            and benefits of the procedure and the sedation                            options and risks were discussed with the patient.                            All questions were answered, and informed consent                            was obtained. Prior Anticoagulants: The patient has                            taken no previous anticoagulant or antiplatelet                            agents. ASA Grade Assessment: III - A patient with                            severe systemic disease. After reviewing the risks                            and benefits, the patient was deemed in  satisfactory condition to undergo the procedure.                           After obtaining informed consent, the colonoscope                            was passed under direct vision. Throughout the                            procedure, the patient's blood pressure, pulse, and                            oxygen saturations were monitored continuously. The                            CF [id number] #[id number] was  introduced through the anus                            and advanced to the the cecum, identified by                            appendiceal orifice and ileocecal valve. The                            ileocecal valve, appendiceal orifice, and rectum                            were photographed. The quality of the bowel                            preparation was adequate. The colonoscopy was                            somewhat difficult due to multiple diverticula,                            spasm and stenosis in the left colon. Successful                            completion of the procedure was aided by                            withdrawing the adult colonoscope and replacing                            with the pediatric colonoscope. The patient                            tolerated the procedure well. Scope In: 3:33:50 PM Scope Out: 4:00:41 PM Scope Withdrawal Time: 0 hours 16 minutes 43 seconds  Total Procedure Duration: 0 hours 26 minutes 51 seconds  Findings:                 The perianal and digital rectal examinations were  normal.                           A 8 mm polyp was found in the ascending colon. The                            polyp was semi-pedunculated. The polyp was removed                            with a cold snare. Resection and retrieval were                            complete.                           Multiple small-mouthed diverticula were found in                            the right colon. There was no evidence of                            diverticular bleeding.                           Multiple small-mouthed diverticula were found in                            the left colon. There was stenosis and narrowing of                            the colon in association with the diverticular                            openings. There was evidence of severe diverticular                            spasm. There was no evidence of diverticular                             bleeding.                           External hemorrhoids were found during                            retroflexion. The hemorrhoids were small.                           The exam was otherwise without abnormality on                            direct and retroflexion views. Complications:            No immediate complications. Estimated blood loss:  None. Estimated Blood Loss:     Estimated blood loss: none. Impression:               - One 8 mm polyp in the ascending colon, removed                            with a cold snare. Resected and retrieved.                           - Moderate diverticulosis in the right colon.                           - Severe diverticulosis in the left colon. There                            was stenosis and narrowing of the colon in                            association with the diverticular openings. There                            was evidence of diverticular spasm.                           - External hemorrhoids.                           - The examination was otherwise normal on direct                            and retroflexion views. Recommendation:           - Repeat colonoscopy, likely 5 years, date to be                            determined after pending pathology results are                            reviewed for surveillance based on pathology                            results.                           - Patient has a contact number available for                            emergencies. The signs and symptoms of potential                            delayed complications were discussed with the                            patient. Return to normal activities tomorrow.  Written discharge instructions were provided to the                            patient.                           - High fiber diet long term.                           - Continue present  medications.                           - Benefiber daily.                           - Await pathology results. Ladene Artist, MD 01/24/2021 4:15:58 PM This report has been signed electronically.

## 2021-01-24 NOTE — Progress Notes (Signed)
Called to room to assist during endoscopic procedure.  Patient ID and intended procedure confirmed with present staff. Received instructions for my participation in the procedure from the performing physician.  

## 2021-01-26 ENCOUNTER — Telehealth: Payer: Self-pay | Admitting: *Deleted

## 2021-01-26 ENCOUNTER — Telehealth: Payer: Self-pay

## 2021-01-26 NOTE — Telephone Encounter (Signed)
Attempted 2nd f/u phone call. No answer. Mailbox is full, unable to leave message.

## 2021-01-26 NOTE — Telephone Encounter (Signed)
Unable to leave message mailbox full.

## 2021-02-05 ENCOUNTER — Encounter: Payer: Self-pay | Admitting: Gastroenterology

## 2021-04-17 ENCOUNTER — Other Ambulatory Visit: Payer: Self-pay

## 2021-04-17 ENCOUNTER — Ambulatory Visit (INDEPENDENT_AMBULATORY_CARE_PROVIDER_SITE_OTHER): Payer: Self-pay

## 2021-04-17 ENCOUNTER — Ambulatory Visit: Payer: Self-pay

## 2021-04-17 ENCOUNTER — Ambulatory Visit (INDEPENDENT_AMBULATORY_CARE_PROVIDER_SITE_OTHER): Payer: Self-pay | Admitting: Orthopaedic Surgery

## 2021-04-17 ENCOUNTER — Encounter: Payer: Self-pay | Admitting: Orthopaedic Surgery

## 2021-04-17 VITALS — Ht 67.5 in | Wt 253.0 lb

## 2021-04-17 DIAGNOSIS — M1712 Unilateral primary osteoarthritis, left knee: Secondary | ICD-10-CM

## 2021-04-17 DIAGNOSIS — M25512 Pain in left shoulder: Secondary | ICD-10-CM

## 2021-04-17 DIAGNOSIS — M25511 Pain in right shoulder: Secondary | ICD-10-CM

## 2021-04-17 DIAGNOSIS — G8929 Other chronic pain: Secondary | ICD-10-CM

## 2021-04-17 DIAGNOSIS — M5441 Lumbago with sciatica, right side: Secondary | ICD-10-CM

## 2021-04-17 MED ORDER — PREDNISONE 10 MG (21) PO TBPK: ORAL_TABLET | ORAL | 0 refills | Status: DC

## 2021-04-17 NOTE — Progress Notes (Signed)
Office Visit Note   Patient: Olivia Gutierrez           Date of Birth: 1955/12/01           MRN: [id number] Visit Date: 04/17/2021              Requested by: Benito Mccreedy, MD [id number] [place] [address] PCP: Benito Mccreedy, MD   Assessment & Plan: Visit Diagnoses:  1. Chronic pain of both shoulders   2. Unilateral primary osteoarthritis, left knee   3. Chronic right-sided low back pain with right-sided sciatica     Plan: Impression is bilateral shoulder pain, left knee osteoarthritis and right-sided back pain with right lower extremity radiculopathy.  At this point, I would like to start the patient on a steroid for her shoulders and start her in physical therapy.  Internal referral has been made.  In regards to her left knee, she would like cortisone injection today which was performed.  In regards to her back, we will order an MRI to further assess for structural abnormalities.  Follow-up with [place] once completed.  Follow-Up Instructions: Return for after MRI.   Orders:  Orders Placed This Encounter  Procedures   Large Joint Inj: L knee   XR Shoulder Left   XR Shoulder Right   MR Lumbar Spine w/o contrast   Ambulatory referral to Physical Therapy   Meds ordered this encounter  Medications   predniSONE (STERAPRED UNI-PAK 21 TAB) 10 MG (21) TBPK tablet    Sig: Take as directed    Dispense:  21 tablet    Refill:  0      Procedures: Large Joint Inj: L knee on 04/17/2021 10:46 AM Indications: pain Details: 22 G needle, anterolateral approach Medications: 2 mL lidocaine 1 %; 2 mL bupivacaine 0.25 %; 40 mg methylPREDNISolone acetate 40 MG/ML     Clinical Data: No additional findings.   Subjective: Chief Complaint  Patient presents with   Lower Back - Pain   Right Shoulder - Pain   Left Shoulder - Pain    HPI patient is a pleasant 66 year old female who comes in today with bilateral shoulder pain, left knee pain and  recurrent right-sided low back pain and right lower extremity radiculopathy.  In regards to her shoulders, she has had pain following receiving the COVID booster shots greater than 6 months ago.  Left shoulder is typically worse but the right is flared up today.  The pain is to the entire shoulder and radiates into the deltoid.  Any range of motion of the shoulders worsens her symptoms.  She has been taking Tylenol and using various rubs without significant relief.  No previous cortisone injection to either shoulder.  In regards to her left knee, she has a history of osteoarthritis.  She has had cortisone injections in the past with good relief.  She has been recently complaining of increased pain with activity and is requesting repeat injection today.  In regards to her back, we have seen her for this in the past with where she has been to therapy and water aerobics as well as been treated with NSAIDs and steroids without long-lasting relief.  She continues to have pain to the right lower back and right lower extremity radiculopathy with associated paresthesias.  Review of Systems as detailed in HPI.  All others reviewed and are negative.   Objective: Vital Signs: Ht 5' 7.5" (1.715 m)    Wt 253 lb (114.8 kg)  BMI 39.04 kg/m   Physical Exam well-developed well-nourished female no acute distress.  Alert and oriented x3.  Ortho Exam left knee exam shows range of motion from 0 to 95 degrees.  Lateral joint line tenderness.  She is neurovascular tact distally.  Bilateral shoulder exam shows limited range of motion secondary to pain and guarding.  She does have tenderness to palpation to both shoulders.  She is neurovascular intact distally.  Stable lumbar exam.  Specialty Comments:  No specialty comments available.  Imaging: XR Shoulder Left  Result Date: 04/17/2021 X-rays demonstrate moderate degenerative changes the [facility] joint  XR Shoulder Right  Result Date: 04/17/2021 X-rays demonstrate  moderate degenerative changes the [facility] joint    PMFS History: Patient Active Problem List   Diagnosis Date Noted   SOB (shortness of breath) 09/29/2014   Essential hypertension 09/29/2014   Chronic anemia 09/29/2014   Stricture and stenosis of esophagus 11/19/2013   Special screening for malignant neoplasms, colon 10/20/2013   Esophageal reflux 10/20/2013   Dysphagia 10/20/2013   Past Medical History:  Diagnosis Date   Anemia    Arthritis    Asthma    Diverticulosis    GERD (gastroesophageal reflux disease)    Hyperlipidemia    Hypertension    Morbid obesity ([place])     Family History  Problem Relation Age of Onset   Heart attack Mother    Stroke Father    Lung disease Father    Heart failure Sister    Heart attack Sister    Cystic fibrosis Sister        x 4   Colon cancer Sister    Colon cancer Brother    Stomach cancer Neg Hx    Pancreatic cancer Neg Hx    Esophageal cancer Neg Hx     Past Surgical History:  Procedure Laterality Date   WISDOM TOOTH EXTRACTION     Social History   Occupational History   Occupation: Wellsite geologist  Tobacco Use   Smoking status: Never   Smokeless tobacco: Never  Vaping Use   Vaping Use: Never used  Substance and Sexual Activity   Alcohol use: Yes    Comment: occ glass of wine   Drug use: No   Sexual activity: Yes    Birth control/protection: Post-menopausal

## 2021-04-18 MED ORDER — LIDOCAINE HCL 1 % IJ SOLN
2.0000 mL | INTRAMUSCULAR | Status: AC | PRN
  Administered 2021-04-17: 2 mL

## 2021-04-18 MED ORDER — METHYLPREDNISOLONE ACETATE 40 MG/ML IJ SUSP
40.0000 mg | INTRAMUSCULAR | Status: AC | PRN
  Administered 2021-04-17: 40 mg via INTRA_ARTICULAR

## 2021-04-18 MED ORDER — BUPIVACAINE HCL 0.25 % IJ SOLN
2.0000 mL | INTRAMUSCULAR | Status: AC | PRN
  Administered 2021-04-17: 2 mL via INTRA_ARTICULAR

## 2021-11-29 DIAGNOSIS — K219 Gastro-esophageal reflux disease without esophagitis: Secondary | ICD-10-CM | POA: Diagnosis not present

## 2021-11-29 DIAGNOSIS — M170 Bilateral primary osteoarthritis of knee: Secondary | ICD-10-CM | POA: Diagnosis not present

## 2021-11-29 DIAGNOSIS — K5790 Diverticulosis of intestine, part unspecified, without perforation or abscess without bleeding: Secondary | ICD-10-CM | POA: Diagnosis not present

## 2021-11-29 DIAGNOSIS — K649 Unspecified hemorrhoids: Secondary | ICD-10-CM | POA: Diagnosis not present

## 2021-11-29 DIAGNOSIS — M5386 Other specified dorsopathies, lumbar region: Secondary | ICD-10-CM | POA: Diagnosis not present

## 2021-11-29 DIAGNOSIS — I10 Essential (primary) hypertension: Secondary | ICD-10-CM | POA: Diagnosis not present

## 2021-11-29 DIAGNOSIS — K449 Diaphragmatic hernia without obstruction or gangrene: Secondary | ICD-10-CM | POA: Diagnosis not present

## 2021-11-29 DIAGNOSIS — K222 Esophageal obstruction: Secondary | ICD-10-CM | POA: Diagnosis not present

## 2021-11-29 DIAGNOSIS — M5136 Other intervertebral disc degeneration, lumbar region: Secondary | ICD-10-CM | POA: Diagnosis not present

## 2021-12-13 DIAGNOSIS — I10 Essential (primary) hypertension: Secondary | ICD-10-CM | POA: Diagnosis not present

## 2021-12-13 DIAGNOSIS — Z23 Encounter for immunization: Secondary | ICD-10-CM | POA: Diagnosis not present

## 2021-12-13 DIAGNOSIS — Z20822 Contact with and (suspected) exposure to covid-19: Secondary | ICD-10-CM | POA: Diagnosis not present

## 2022-01-30 DIAGNOSIS — Z6841 Body Mass Index (BMI) 40.0 and over, adult: Secondary | ICD-10-CM | POA: Diagnosis not present

## 2022-01-30 DIAGNOSIS — R002 Palpitations: Secondary | ICD-10-CM | POA: Diagnosis not present

## 2022-01-30 DIAGNOSIS — I10 Essential (primary) hypertension: Secondary | ICD-10-CM | POA: Diagnosis not present

## 2022-01-30 DIAGNOSIS — Z23 Encounter for immunization: Secondary | ICD-10-CM | POA: Diagnosis not present

## 2022-01-30 DIAGNOSIS — Z0000 Encounter for general adult medical examination without abnormal findings: Secondary | ICD-10-CM | POA: Diagnosis not present

## 2022-02-01 ENCOUNTER — Other Ambulatory Visit: Payer: Self-pay | Admitting: Physician Assistant

## 2022-02-01 DIAGNOSIS — Z1231 Encounter for screening mammogram for malignant neoplasm of breast: Secondary | ICD-10-CM

## 2022-02-15 DIAGNOSIS — M5441 Lumbago with sciatica, right side: Secondary | ICD-10-CM | POA: Diagnosis not present

## 2022-02-18 ENCOUNTER — Ambulatory Visit: Payer: No Typology Code available for payment source | Admitting: Internal Medicine

## 2022-02-21 DIAGNOSIS — M170 Bilateral primary osteoarthritis of knee: Secondary | ICD-10-CM | POA: Diagnosis not present

## 2022-02-21 DIAGNOSIS — M5441 Lumbago with sciatica, right side: Secondary | ICD-10-CM | POA: Diagnosis not present

## 2022-02-21 DIAGNOSIS — I10 Essential (primary) hypertension: Secondary | ICD-10-CM | POA: Diagnosis not present

## 2022-02-21 DIAGNOSIS — Z6841 Body Mass Index (BMI) 40.0 and over, adult: Secondary | ICD-10-CM | POA: Diagnosis not present

## 2022-02-21 DIAGNOSIS — M5136 Other intervertebral disc degeneration, lumbar region: Secondary | ICD-10-CM | POA: Diagnosis not present

## 2022-02-26 ENCOUNTER — Ambulatory Visit: Payer: No Typology Code available for payment source | Admitting: Internal Medicine

## 2022-02-26 ENCOUNTER — Encounter: Payer: Self-pay | Admitting: Internal Medicine

## 2022-02-26 VITALS — BP 146/100 | HR 58 | Ht 66.5 in | Wt 274.0 lb

## 2022-02-26 DIAGNOSIS — R002 Palpitations: Secondary | ICD-10-CM | POA: Diagnosis not present

## 2022-02-26 DIAGNOSIS — M7989 Other specified soft tissue disorders: Secondary | ICD-10-CM | POA: Diagnosis not present

## 2022-02-26 DIAGNOSIS — I10 Essential (primary) hypertension: Secondary | ICD-10-CM | POA: Diagnosis not present

## 2022-02-26 MED ORDER — BUMETANIDE 2 MG PO TABS: 2.0000 mg | ORAL_TABLET | Freq: Every day | ORAL | 2 refills | Status: DC

## 2022-02-26 NOTE — Progress Notes (Signed)
Primary Physician/Referring:  Amelia Jo, PA  Patient ID: Olivia Gutierrez, female    DOB: August 24, 1955, 66 y.o.   MRN: [id number]  Chief Complaint  Patient presents with   Palpitations   New Patient (Initial Visit)        Leg Swelling   HPI:    Olivia Gutierrez  is a 65 y.o. female with past medical history significant for hypertension, asthma, and GERD who is here to establish care with cardiology.  Patient has extensive family history of heart disease, she states that every single one of her siblings have heart disease.  She denies any cardiac history in herself so far.  She does admit her blood pressure has been very labile lately but she recently established with a new primary care doctor and was started on antihypertensives at that time.  Patient is currently tolerating losartan and hydrochlorothiazide without issue.  She states she is very swollen and uncomfortable.  In the last few weeks, she has gained about 15 pounds in water weight.  Her feet do not fit in her shoes anymore and the patient states this is ever happened to her in the past.  She states that her skin feels really tight and she is very short of breath all of the time.  Usually she is very active and does not have these types of issues.  She does not smoke or drink alcohol.  She denies chest pain, diaphoresis, syncope, claudication, PND.  Past Medical History:  Diagnosis Date   Anemia    Arthritis    Asthma    Diverticulosis    GERD (gastroesophageal reflux disease)    Hyperlipidemia    Hypertension    Morbid obesity ([place])    Past Surgical History:  Procedure Laterality Date   WISDOM TOOTH EXTRACTION     Family History  Problem Relation Age of Onset   Heart attack Mother    Stroke Father    Lung disease Father    Heart failure Sister    Heart attack Sister    Cystic fibrosis Sister        x 4   Colon cancer Sister    Colon cancer Brother    Stomach cancer Neg Hx    Pancreatic cancer Neg  Hx    Esophageal cancer Neg Hx     Social History   Tobacco Use   Smoking status: Never   Smokeless tobacco: Never  Substance Use Topics   Alcohol use: Yes    Comment: occ glass of wine   Marital Status: Married  ROS  Review of Systems  Cardiovascular:  Positive for dyspnea on exertion, leg swelling, orthopnea and palpitations.  Respiratory:  Positive for shortness of breath.    Objective  Blood pressure (!) 146/100, pulse (!) 58, height 5' 6.5" (1.689 m), weight 274 lb (124.3 kg), SpO2 98 %. Body mass index is 43.56 kg/m.     02/26/2022    9:02 AM 02/26/2022    8:52 AM 04/17/2021    9:58 AM  Vitals with BMI  Height  5' 6.5" 5' 7.5"  Weight  274 lbs 253 lbs  BMI  56.21 30.86  Systolic 578 469   Diastolic 629 97   Pulse 58 62      Physical Exam Vitals reviewed.  HENT:     Head: Normocephalic and atraumatic.  Cardiovascular:     Rate and Rhythm: Normal rate and regular rhythm.     Pulses: Normal pulses.     Heart  sounds: Normal heart sounds. No murmur heard. Pulmonary:     Effort: Pulmonary effort is normal.     Breath sounds: Examination of the right-lower field reveals decreased breath sounds. Examination of the left-lower field reveals decreased breath sounds. Decreased breath sounds present.  Abdominal:     General: Bowel sounds are normal.  Musculoskeletal:     Right lower leg: 2+ Pitting Edema present.     Left lower leg: 2+ Pitting Edema present.  Skin:    General: Skin is warm and dry.  Neurological:     Mental Status: She is alert.     Medications and allergies   Allergies  Allergen Reactions   Other Shortness Of Breath and Itching    Certain peanuts.. Pecans, walnuts   Penicillins Hives and Rash    Has patient had a PCN reaction causing immediate rash, facial/tongue/throat swelling, SOB or lightheadedness with hypotension:unknown Has patient had a PCN reaction causing severe rash involving mucus membranes or skin necrosis: unknown Has patient  had a PCN reaction that required hospitalization : yes Has patient had a PCN reaction occurring within the last 10 years: no, childhood allergy If all of the above answers are "NO", then may proceed with Cephalosporin use.      Medication list after today's encounter   Current Outpatient Medications:    albuterol (VENTOLIN HFA) 108 (90 Base) MCG/ACT inhaler, Inhale 2 puffs into the lungs every 6 (six) hours as needed for wheezing or shortness of breath., Disp: , Rfl:    bumetanide (BUMEX) 2 MG tablet, Take 1 tablet (2 mg total) by mouth daily., Disp: 30 tablet, Rfl: 2   cholecalciferol (VITAMIN D3) 25 MCG (1000 UNIT) tablet, Take 1,000 Units by mouth daily., Disp: , Rfl:    gabapentin (NEURONTIN) 300 MG capsule, Take 300 mg by mouth 3 (three) times daily., Disp: , Rfl:    hydrochlorothiazide (HYDRODIURIL) 25 MG tablet, Take 25 mg by mouth daily., Disp: , Rfl:    losartan (COZAAR) 50 MG tablet, Take 50 mg by mouth daily., Disp: , Rfl:    methocarbamol (ROBAXIN) 750 MG tablet, Take 750 mg by mouth 4 (four) times daily., Disp: , Rfl:    Multiple Vitamins-Minerals (MULTIVITAMIN ADULTS) TABS, Take 1 tablet by mouth daily., Disp: , Rfl:    omeprazole (PRILOSEC) 40 MG capsule, Take 1 capsule (40 mg total) by mouth in the morning and at bedtime., Disp: 90 capsule, Rfl: 7   vitamin C (ASCORBIC ACID) 500 MG tablet, Take 500 mg by mouth daily., Disp: , Rfl:   Laboratory examination:   Lab Results  Component Value Date   NA 137 11/14/2020   K 3.7 11/14/2020   CO2 25 11/14/2020   GLUCOSE 105 (H) 11/14/2020   BUN 11 11/14/2020   CREATININE 0.89 11/14/2020   CALCIUM 9.5 11/14/2020   GFRNONAA >60 11/14/2020       Latest Ref Rng & Units 11/14/2020   10:15 AM 10/26/2020    5:36 PM 05/20/2019   11:20 AM  CMP  Glucose 70 - 99 mg/dL 105  93  81   BUN 8 - 23 mg/dL '11  13  15   '$ Creatinine 0.44 - 1.00 mg/dL 0.89  0.72  0.77   Sodium 135 - 145 mmol/L 137  138  141   Potassium 3.5 - 5.1 mmol/L 3.7  3.8   4.6   Chloride 98 - 111 mmol/L 103  102  105   CO2 22 - 32 mmol/L 25  27  27   Calcium 8.9 - 10.3 mg/dL 9.5  9.6  9.4   Total Protein 6.5 - 8.1 g/dL 6.2   6.2   Total Bilirubin 0.3 - 1.2 mg/dL 0.8   <0.2   Alkaline Phos 38 - 126 U/L 62   90   AST 15 - 41 U/L 15   8   ALT 0 - 44 U/L 12   8       Latest Ref Rng & Units 11/14/2020   10:15 AM 10/26/2020    5:36 PM 05/20/2019   11:20 AM  CBC  WBC 4.0 - 10.5 K/uL 7.9  5.2  5.7   Hemoglobin 12.0 - 15.0 g/dL 11.7  11.9  12.0   Hematocrit 36.0 - 46.0 % 37.0  38.0  37.3   Platelets 150 - 400 K/uL 298  287  307     Lipid Panel No results for input(s): "CHOL", "TRIG", "[place]", "VLDL", "HDL", "CHOLHDL", "LDLDIRECT" in the last 8760 hours.  HEMOGLOBIN A1C Lab Results  Component Value Date   HGBA1C 5.3 05/20/2019   TSH No results for input(s): "TSH" in the last 8760 hours.  External labs:     Radiology:    Cardiac Studies:     EKG:   02/26/2022: normal sinus rhythm with iRBBB. Diffuse T wave inversions.  Assessment     ICD-10-CM   1. Palpitations  R00.2 EKG 12-Lead    PCV ECHOCARDIOGRAM COMPLETE    2. Leg swelling  M79.89 PCV ECHOCARDIOGRAM COMPLETE    Basic metabolic panel    3. Essential hypertension  I10 PCV ECHOCARDIOGRAM COMPLETE    Basic metabolic panel       Orders Placed This Encounter  Procedures   Basic metabolic panel   EKG 95-GLOV   PCV ECHOCARDIOGRAM COMPLETE    Standing Status:   Future    Standing Expiration Date:   02/27/2023    Meds ordered this encounter  Medications   bumetanide (BUMEX) 2 MG tablet    Sig: Take 1 tablet (2 mg total) by mouth daily.    Dispense:  30 tablet    Refill:  2    Medications Discontinued During This Encounter  Medication Reason   omeprazole (PRILOSEC) 20 MG capsule    oxyCODONE-acetaminophen (PERCOCET/ROXICET) 5-325 MG tablet Completed Course   predniSONE (STERAPRED UNI-PAK 21 TAB) 10 MG (21) TBPK tablet Completed Course   triamterene-hydrochlorothiazide  (MAXZIDE-25) 37.5-25 MG tablet Completed Course     Recommendations:   Olivia Gutierrez is a 66 y.o.  female with hypertension and bilateral leg edema  Palpitations Heart rate is controlled Potentially palpitations are due to overt fluid overload If symptoms worsen or become more frequent, will discuss event monitor   Leg swelling Bumex '2mg'$  qDay ordered. She states she has gained 15 lbs in the last few weeks. Patient instructed to weigh herself daily at around the same time every day She will call the office on Friday to inform us of her progress Echocardiogram has been ordered and scheduled as I suspect diastolic heart failure Will obtain BMP on Monday to evaluate Cr and lytes Very close outpatient follow-up with me Patient to come back in approximately 1 week or sooner if needed   Essential hypertension Continue current cardiac medications. Encourage low-sodium diet, less than 2000 mg daily. Will hold off on adding new meds this visit as pt was just recently started on anti-hypertensives     Olivia Flock, DO, [facility]  02/26/2022, 9:43 AM Office: [phone] Pager: [phone]

## 2022-02-27 LAB — BASIC METABOLIC PANEL
BUN/Creatinine Ratio: 15 (ref 12–28)
BUN: 13 mg/dL (ref 8–27)
CO2: 25 mmol/L (ref 20–29)
Calcium: 9.5 mg/dL (ref 8.7–10.3)
Chloride: 103 mmol/L (ref 96–106)
Creatinine, Ser: 0.88 mg/dL (ref 0.57–1.00)
Glucose: 87 mg/dL (ref 70–99)
Potassium: 3.9 mmol/L (ref 3.5–5.2)
Sodium: 141 mmol/L (ref 134–144)
eGFR: 73 mL/min/{1.73_m2} (ref >59)

## 2022-02-28 ENCOUNTER — Other Ambulatory Visit: Payer: No Typology Code available for payment source

## 2022-02-28 ENCOUNTER — Ambulatory Visit: Payer: No Typology Code available for payment source

## 2022-02-28 DIAGNOSIS — I10 Essential (primary) hypertension: Secondary | ICD-10-CM

## 2022-02-28 DIAGNOSIS — R002 Palpitations: Secondary | ICD-10-CM | POA: Diagnosis not present

## 2022-02-28 DIAGNOSIS — M7989 Other specified soft tissue disorders: Secondary | ICD-10-CM | POA: Diagnosis not present

## 2022-03-06 ENCOUNTER — Ambulatory Visit: Payer: No Typology Code available for payment source | Admitting: Orthopaedic Surgery

## 2022-03-06 ENCOUNTER — Ambulatory Visit: Payer: No Typology Code available for payment source | Admitting: Internal Medicine

## 2022-03-08 ENCOUNTER — Ambulatory Visit: Payer: No Typology Code available for payment source | Admitting: Orthopaedic Surgery

## 2022-03-26 ENCOUNTER — Ambulatory Visit: Payer: Self-pay | Admitting: Internal Medicine

## 2022-03-26 ENCOUNTER — Encounter: Payer: Self-pay | Admitting: Internal Medicine

## 2022-03-26 VITALS — BP 165/91 | HR 66 | Ht 66.5 in | Wt 272.0 lb

## 2022-03-26 DIAGNOSIS — R002 Palpitations: Secondary | ICD-10-CM | POA: Diagnosis not present

## 2022-03-26 DIAGNOSIS — I10 Essential (primary) hypertension: Secondary | ICD-10-CM

## 2022-03-26 NOTE — Progress Notes (Signed)
Primary Physician/Referring:  Amelia Jo, PA  Patient ID: Olivia Gutierrez, female    DOB: 11-11-1955, 67 y.o.   MRN: [id number]  Chief Complaint  Patient presents with   Palpitations   Follow-up   Results   HPI:    Olivia Gutierrez  is a 67 y.o. female with past medical history significant for hypertension, asthma, and GERD who is here for a follow-up visit. Patient has been doing well since the last time she was here. No new concerns or complaints.  She denies chest pain, diaphoresis, syncope, claudication, PND.  Past Medical History:  Diagnosis Date   Anemia    Arthritis    Asthma    Diverticulosis    GERD (gastroesophageal reflux disease)    Hyperlipidemia    Hypertension    Morbid obesity ([place])    Past Surgical History:  Procedure Laterality Date   WISDOM TOOTH EXTRACTION     Family History  Problem Relation Age of Onset   Heart attack Mother    Stroke Father    Lung disease Father    Heart failure Sister    Heart attack Sister    Cystic fibrosis Sister        x 4   Colon cancer Sister    Colon cancer Brother    Stomach cancer Neg Hx    Pancreatic cancer Neg Hx    Esophageal cancer Neg Hx     Social History   Tobacco Use   Smoking status: Never   Smokeless tobacco: Never  Substance Use Topics   Alcohol use: Yes    Comment: occ glass of wine   Marital Status: Married  ROS  Review of Systems  Cardiovascular:  Negative for dyspnea on exertion, leg swelling, orthopnea and palpitations.  Respiratory:  Negative for shortness of breath.    Objective  Blood pressure (!) 165/91, pulse 66, height 5' 6.5" (1.689 m), weight 272 lb (123.4 kg), SpO2 99 %. Body mass index is 43.24 kg/m.     03/26/2022    8:44 AM 02/26/2022    9:02 AM 02/26/2022    8:52 AM  Vitals with BMI  Height 5' 6.5"  5' 6.5"  Weight 272 lbs  274 lbs  BMI 50.27  74.12  Systolic 878 676 720  Diastolic 91 947 97  Pulse 66 58 62     Physical Exam Vitals reviewed.   HENT:     Head: Normocephalic and atraumatic.  Cardiovascular:     Rate and Rhythm: Normal rate and regular rhythm.     Pulses: Normal pulses.     Heart sounds: Normal heart sounds. No murmur heard. Pulmonary:     Effort: Pulmonary effort is normal.     Breath sounds: Examination of the right-lower field reveals decreased breath sounds. Examination of the left-lower field reveals decreased breath sounds. Decreased breath sounds present.  Abdominal:     General: Bowel sounds are normal.  Musculoskeletal:     Right lower leg: 2+ Pitting Edema present.     Left lower leg: 2+ Pitting Edema present.  Skin:    General: Skin is warm and dry.  Neurological:     Mental Status: She is alert.     Medications and allergies   Allergies  Allergen Reactions   Other Shortness Of Breath and Itching    Certain peanuts.. Pecans, walnuts   Penicillins Hives and Rash    Has patient had a PCN reaction causing immediate rash, facial/tongue/throat swelling, SOB or lightheadedness with  hypotension:unknown Has patient had a PCN reaction causing severe rash involving mucus membranes or skin necrosis: unknown Has patient had a PCN reaction that required hospitalization : yes Has patient had a PCN reaction occurring within the last 10 years: no, childhood allergy If all of the above answers are "NO", then may proceed with Cephalosporin use.      Medication list after today's encounter   Current Outpatient Medications:    albuterol (VENTOLIN HFA) 108 (90 Base) MCG/ACT inhaler, Inhale 2 puffs into the lungs every 6 (six) hours as needed for wheezing or shortness of breath., Disp: , Rfl:    bumetanide (BUMEX) 2 MG tablet, Take 1 tablet (2 mg total) by mouth daily., Disp: 30 tablet, Rfl: 2   cholecalciferol (VITAMIN D3) 25 MCG (1000 UNIT) tablet, Take 1,000 Units by mouth daily., Disp: , Rfl:    hydrochlorothiazide (HYDRODIURIL) 25 MG tablet, Take 25 mg by mouth daily., Disp: , Rfl:    losartan (COZAAR)  50 MG tablet, Take 50 mg by mouth daily., Disp: , Rfl:    Multiple Vitamins-Minerals (MULTIVITAMIN ADULTS) TABS, Take 1 tablet by mouth daily., Disp: , Rfl:    omeprazole (PRILOSEC) 40 MG capsule, Take 1 capsule (40 mg total) by mouth in the morning and at bedtime., Disp: 90 capsule, Rfl: 7   vitamin C (ASCORBIC ACID) 500 MG tablet, Take 500 mg by mouth daily., Disp: , Rfl:    methocarbamol (ROBAXIN) 750 MG tablet, Take 750 mg by mouth 4 (four) times daily. (Patient not taking: Reported on 03/26/2022), Disp: , Rfl:   Laboratory examination:   Lab Results  Component Value Date   NA 141 02/26/2022   K 3.9 02/26/2022   CO2 25 02/26/2022   GLUCOSE 87 02/26/2022   BUN 13 02/26/2022   CREATININE 0.88 02/26/2022   CALCIUM 9.5 02/26/2022   EGFR 73 02/26/2022   GFRNONAA >60 11/14/2020       Latest Ref Rng & Units 02/26/2022    1:18 PM 11/14/2020   10:15 AM 10/26/2020    5:36 PM  CMP  Glucose 70 - 99 mg/dL 87  105  93   BUN 8 - 27 mg/dL '13  11  13   '$ Creatinine 0.57 - 1.00 mg/dL 0.88  0.89  0.72   Sodium 134 - 144 mmol/L 141  137  138   Potassium 3.5 - 5.2 mmol/L 3.9  3.7  3.8   Chloride 96 - 106 mmol/L 103  103  102   CO2 20 - 29 mmol/L '25  25  27   '$ Calcium 8.7 - 10.3 mg/dL 9.5  9.5  9.6   Total Protein 6.5 - 8.1 g/dL  6.2    Total Bilirubin 0.3 - 1.2 mg/dL  0.8    Alkaline Phos 38 - 126 U/L  62    AST 15 - 41 U/L  15    ALT 0 - 44 U/L  12        Latest Ref Rng & Units 11/14/2020   10:15 AM 10/26/2020    5:36 PM 05/20/2019   11:20 AM  CBC  WBC 4.0 - 10.5 K/uL 7.9  5.2  5.7   Hemoglobin 12.0 - 15.0 g/dL 11.7  11.9  12.0   Hematocrit 36.0 - 46.0 % 37.0  38.0  37.3   Platelets 150 - 400 K/uL 298  287  307     Lipid Panel No results for input(s): "CHOL", "TRIG", "[place]", "VLDL", "HDL", "CHOLHDL", "LDLDIRECT" in the last 8760  hours.  HEMOGLOBIN A1C Lab Results  Component Value Date   HGBA1C 5.3 05/20/2019   TSH No results for input(s): "TSH" in the last 8760  hours.  External labs:     Radiology:    Cardiac Studies:   Echocardiogram 02/28/2022: Normal LV systolic function with visual EF 60-65%. Left ventricle cavity is normal in size. Moderate left ventricular hypertrophy. Normal global wall motion. Normal diastolic filling pattern, normal LAP. Left atrial cavity is mildly dilated at 41.68 ml/m^2. Mild tricuspid regurgitation. No evidence of pulmonary hypertension. No prior study for comparison.     EKG:   02/26/2022: normal sinus rhythm with iRBBB. Diffuse T wave inversions.  Assessment     ICD-10-CM   1. Palpitations  R00.2     2. Essential hypertension  I10        No orders of the defined types were placed in this encounter.   No orders of the defined types were placed in this encounter.   Medications Discontinued During This Encounter  Medication Reason   gabapentin (NEURONTIN) 300 MG capsule Completed Course     Recommendations:   Olivia Gutierrez is a 67 y.o.  female with hypertension and bilateral leg edema  Palpitations Heart rate is controlled Palpitations significantly improved   Leg swelling Bumex '2mg'$  qDay ordered.  No further edema reported   Essential hypertension Continue current cardiac medications. Encourage low-sodium diet, less than 2000 mg daily.      Floydene Flock, DO, [facility]  03/30/2022, 4:42 PM Office: [phone] Pager: [phone]

## 2022-04-02 ENCOUNTER — Telehealth: Payer: Self-pay

## 2022-04-02 NOTE — Telephone Encounter (Signed)
Yes please

## 2022-04-02 NOTE — Telephone Encounter (Signed)
Initial follow up after starting home blood pressure monitoring with RPM. Home BP is moderately controlled and could likely tolerated medication up titration.  Systolic Blood Pressure mmHg --146.4 (115.0 - 742.5)  Diastolic Blood Pressure mmHg --90.8 (81.0 - 99.0) Heart Rate bpm --64.8 (59.0 - 68.0)  04/01/22 5:44 PM [employer] TMB-... 141 / 82 mmHg 59 bpm  04/01/22 4:26 PM [employer] TMB-... 155 / 96 mmHg 64 bpm  03/31/22 11:18 AM [employer] TMB-... 115 / 81 mmHg 68 bpm  03/30/22 4:20 PM [employer] TMB-... 133 / 88 mmHg 63 bpm  03/29/22 2:56 PM [employer] TMB-... 160 / 99 mmHg 65 bpm  03/28/22 6:21 PM [employer] TMB-... 147 / 97 mmHg 68 bpm  03/28/22 9:48 AM [employer] TMB-... 165 / 93 mmHg 65 bpm  03/26/22 9:14 AM [employer] TMB-... 155 / 90 mmHg 66 bpm

## 2022-04-02 NOTE — Telephone Encounter (Signed)
Ok lets increase

## 2022-04-22 ENCOUNTER — Ambulatory Visit: Payer: Self-pay

## 2022-04-23 DIAGNOSIS — I10 Essential (primary) hypertension: Secondary | ICD-10-CM | POA: Diagnosis not present

## 2022-04-25 NOTE — Telephone Encounter (Signed)
I have been unable to reach patient regarding medication up titration.  Systolic Blood Pressure 929.0 (903.0 - 149.9) Diastolic Blood Pressure 69.2 (79.0 - 117.0) Heart Rate 70.8 (59.0 - 86.0)  04/24/22 3:19 PM [employer] TMB-... 173 / 104 mmHg 78 bpm  04/22/22 12:06 PM [employer] TMB-... 141 / 87 mmHg 73 bpm  04/21/22 4:00 PM [employer] TMB-... 140 / 102 mmHg 77 bpm  04/19/22 8:39 AM [employer] TMB-... 156 / 107 mmHg 64 bpm  04/18/22 1:25 PM [employer] TMB-... 174 / 110 mmHg 68 bpm  04/18/22 1:23 PM [employer] TMB-... 200 / 117 mmHg 78 bpm  04/17/22 7:16 PM [employer] TMB-... 124 / 88 mmHg 75 bpm  04/16/22 2:34 PM [employer] TMB-... 157 / 102 mmHg 72 bpm  04/14/22 1:13 PM [employer] TMB-... 154 / 107 mmHg 72 bpm  04/14/22 10:01 AM [employer] TMB-... 154 / 109 mmHg 86 bpm

## 2022-04-27 IMAGING — CT CT HEAD W/O CM
3 series · 15 of 47 positions shown, 18 images · non-contrast
Comparison: CT 08/07/2009

CLINICAL DATA: Hypertension, headache, denies vision change or
other neurologic symptoms.

EXAM:
CT HEAD WITHOUT CONTRAST
TECHNIQUE: Contiguous axial images were obtained from the base of the skull
through the vertex without intravenous contrast.

[Series 2: head wo · axial · 0.45mm/px · z∈[-156,-21]mm · 9 of 33 slices shown, 12 images]
[im 3/33  brain]
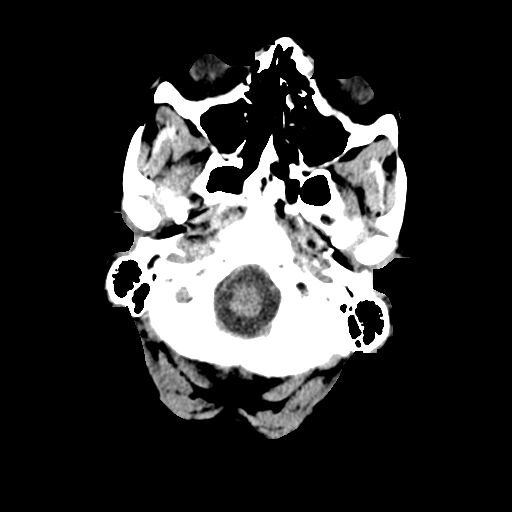
[im 3/33  bone]
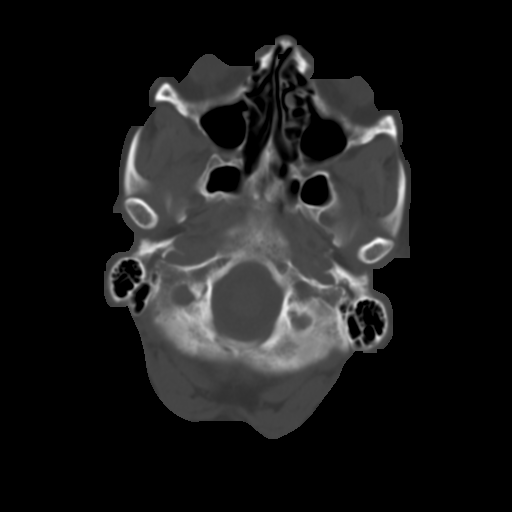
[im 6/33  brain]
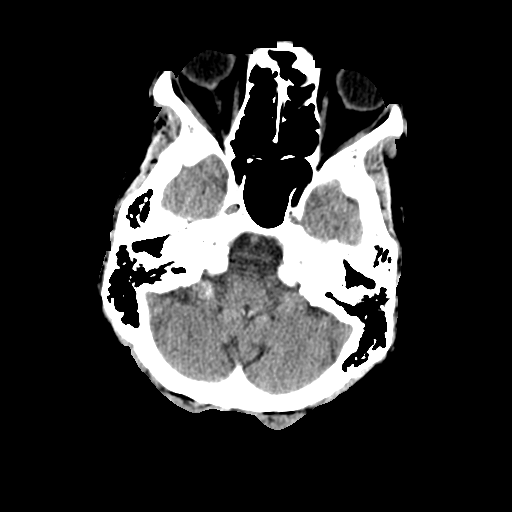
[im 9/33  brain]
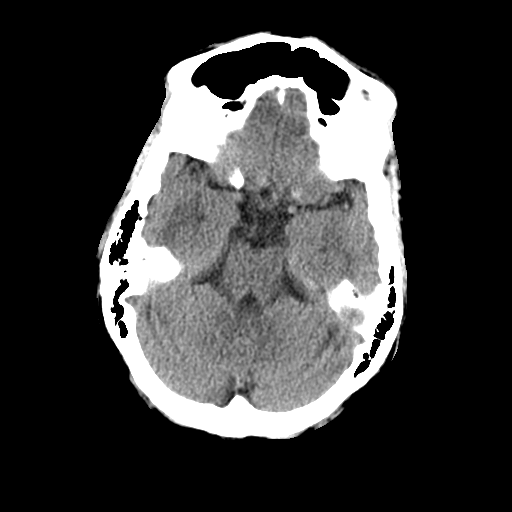
[im 13/33  brain]
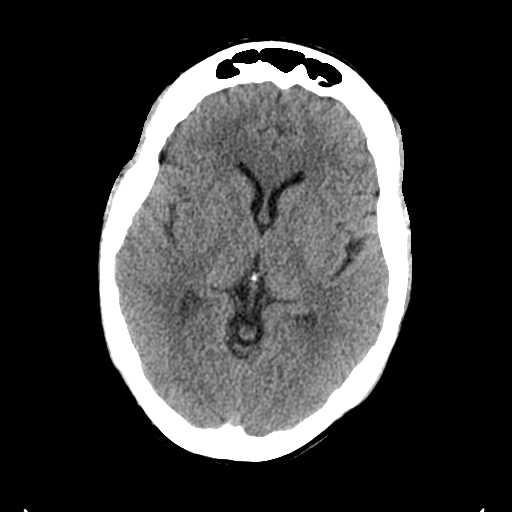
[im 17/33  brain]
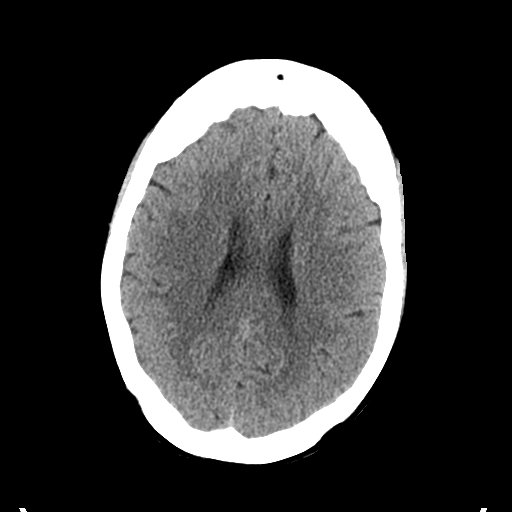
[im 17/33  bone]
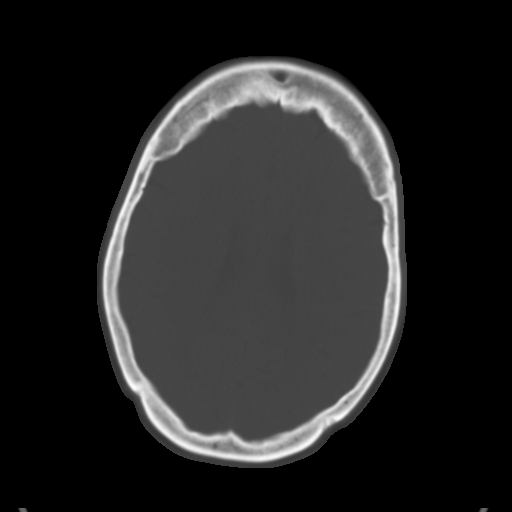
[im 20/33  brain]
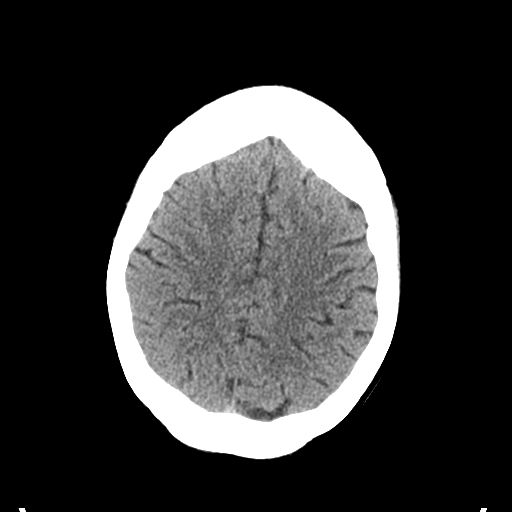
[im 24/33  brain]
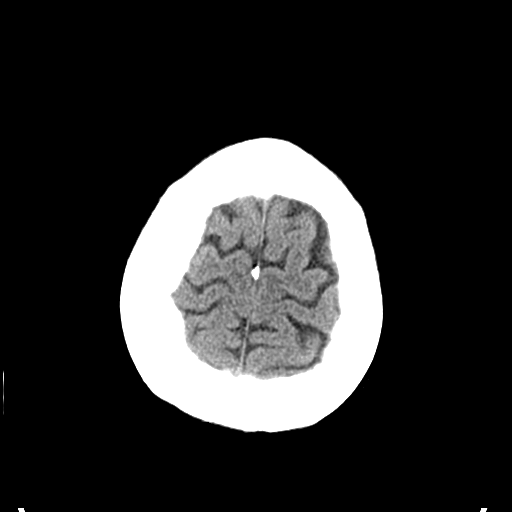
[im 27/33  brain]
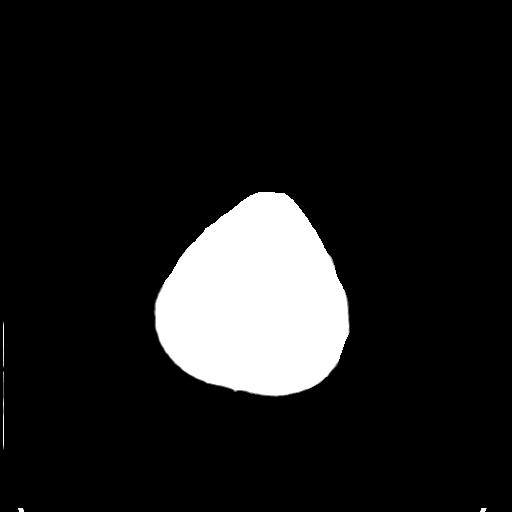
[im 30/33  brain]
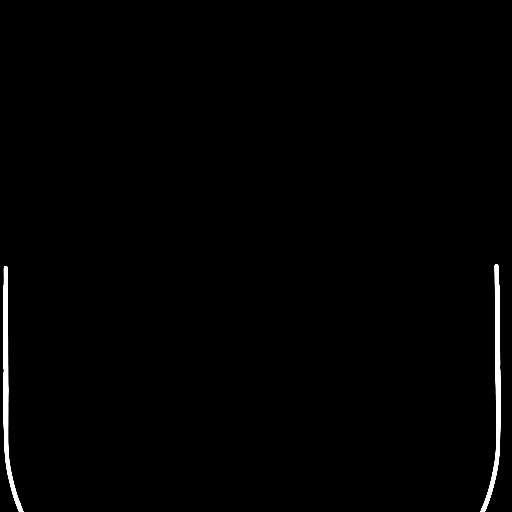
[im 30/33  bone]
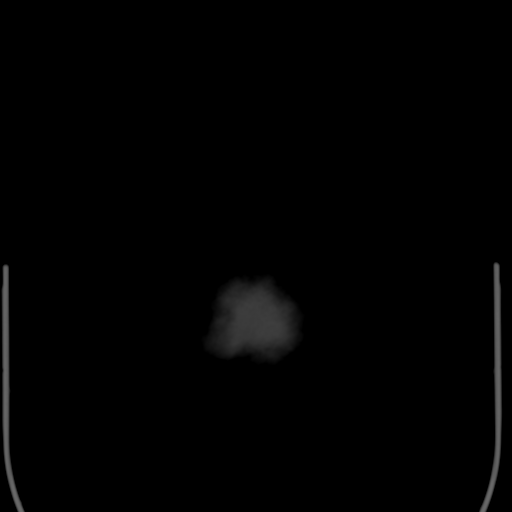

[Series 5: coronal soft tissue · coronal · 0.31mm/px · 3 of 70 slices shown]
[im 24/70  brain]
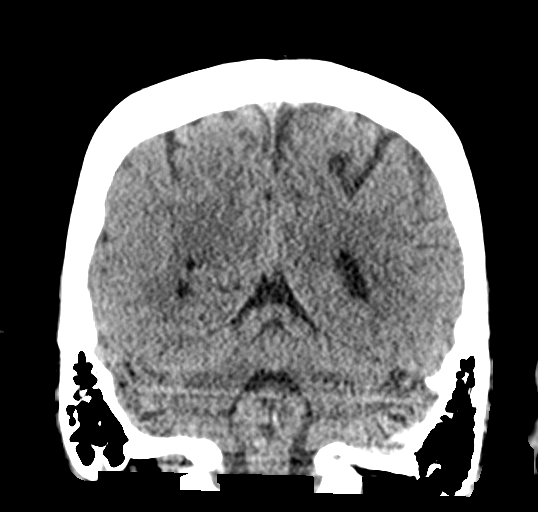
[im 31/70  brain]
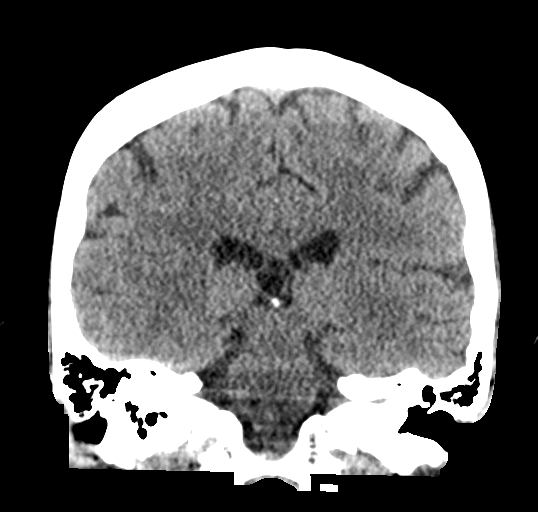
[im 39/70  brain]
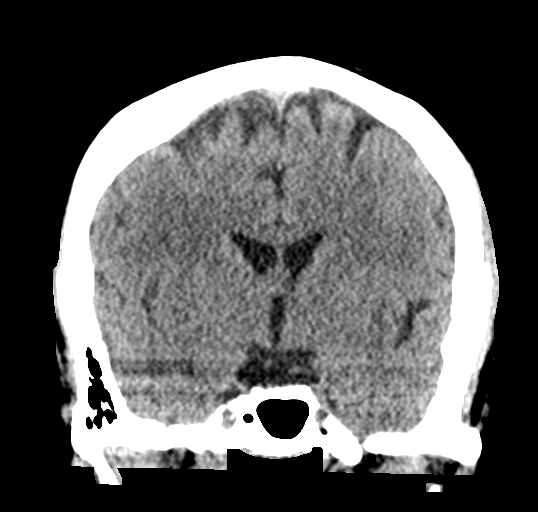

[Series 6: sagittal soft tissue · sagittal · 0.31mm/px · 3 of 56 slices shown]
[im 19/56  brain]
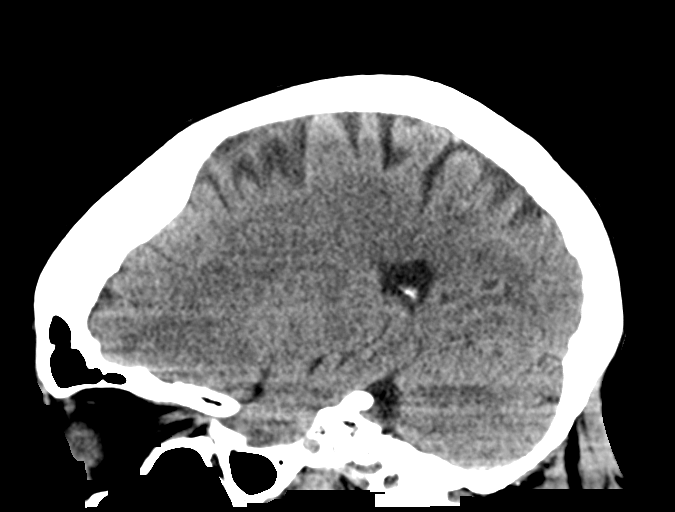
[im 28/56  brain]
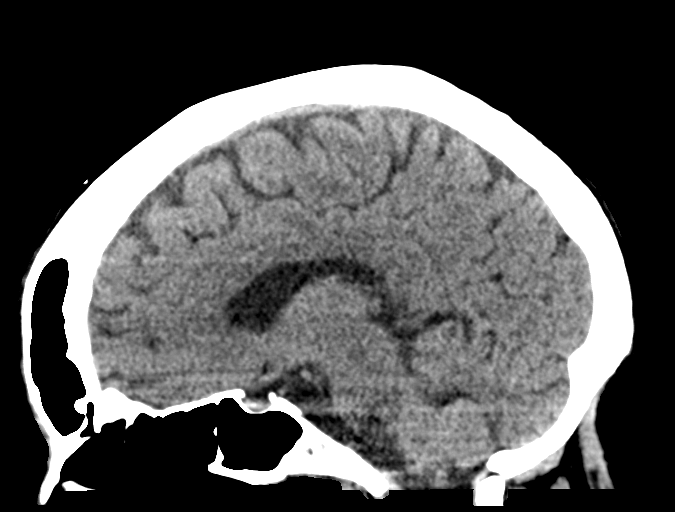
[im 37/56  brain]
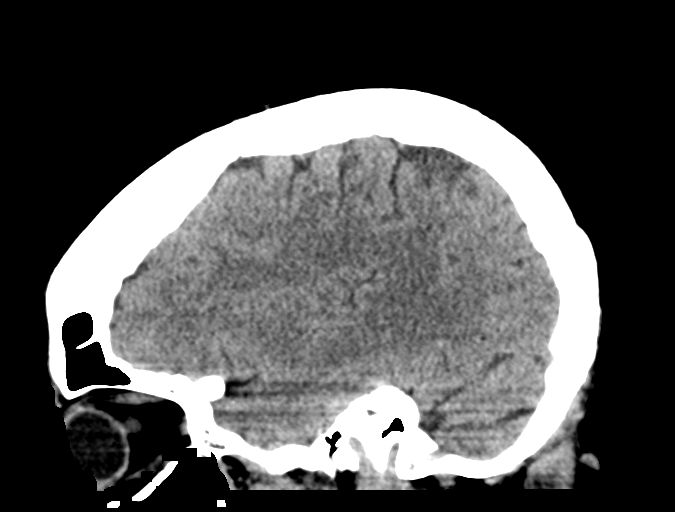

[15 of 47 positions shown; findings below may reference images not displayed]

FINDINGS: Brain: Few stable remote lacunar type infarcts seen in the bilateral
basal ganglia. No evidence of acute infarction, hemorrhage,
hydrocephalus, extra-axial collection, visible mass lesion or mass
effect. Symmetric prominence of the ventricles, cisterns and sulci
compatible with parenchymal volume loss. Patchy areas of white
matter hypoattenuation are most compatible with chronic
microvascular angiopathy.

Vascular: No hyperdense vessel or unexpected calcification.

Skull: No calvarial fracture or suspicious osseous lesion. No scalp
swelling or hematoma.

Sinuses/Orbits: Paranasal sinuses and mastoid air cells are
predominantly clear. Included orbital structures are unremarkable.

Other: None.
IMPRESSION: No acute intracranial abnormality.

Remote lacunar type infarcts in the bilateral basal ganglia, stable
from prior.

Background of mild parenchymal volume loss, microvascular
angiopathy.

## 2022-05-14 ENCOUNTER — Other Ambulatory Visit: Payer: Self-pay | Admitting: Gastroenterology

## 2022-05-14 DIAGNOSIS — R1310 Dysphagia, unspecified: Secondary | ICD-10-CM

## 2022-05-14 DIAGNOSIS — K222 Esophageal obstruction: Secondary | ICD-10-CM

## 2022-05-17 ENCOUNTER — Other Ambulatory Visit: Payer: Self-pay | Admitting: Gastroenterology

## 2022-05-17 DIAGNOSIS — R1310 Dysphagia, unspecified: Secondary | ICD-10-CM

## 2022-05-17 DIAGNOSIS — K222 Esophageal obstruction: Secondary | ICD-10-CM

## 2022-05-23 DIAGNOSIS — I10 Essential (primary) hypertension: Secondary | ICD-10-CM | POA: Diagnosis not present

## 2022-06-11 ENCOUNTER — Other Ambulatory Visit: Payer: Self-pay | Admitting: Internal Medicine

## 2022-06-11 DIAGNOSIS — M7989 Other specified soft tissue disorders: Secondary | ICD-10-CM

## 2022-06-12 LAB — CMP14+EGFR
ALT: 13 IU/L (ref 0–32)
AST: 17 IU/L (ref 0–40)
Albumin/Globulin Ratio: 1.9 (ref 1.2–2.2)
Albumin: 4.2 g/dL (ref 3.9–4.9)
Alkaline Phosphatase: 81 IU/L (ref 44–121)
BUN/Creatinine Ratio: 23 (ref 12–28)
BUN: 20 mg/dL (ref 8–27)
Bilirubin Total: 0.3 mg/dL (ref 0.0–1.2)
CO2: 25 mmol/L (ref 20–29)
Calcium: 9.9 mg/dL (ref 8.7–10.3)
Chloride: 103 mmol/L (ref 96–106)
Creatinine, Ser: 0.88 mg/dL (ref 0.57–1.00)
Globulin, Total: 2.2 g/dL (ref 1.5–4.5)
Glucose: 94 mg/dL (ref 70–99)
Potassium: 4.1 mmol/L (ref 3.5–5.2)
Sodium: 143 mmol/L (ref 134–144)
Total Protein: 6.4 g/dL (ref 6.0–8.5)
eGFR: 73 mL/min/{1.73_m2} (ref >59)

## 2022-06-13 ENCOUNTER — Encounter: Payer: Self-pay | Admitting: Internal Medicine

## 2022-06-13 ENCOUNTER — Telehealth: Payer: Self-pay

## 2022-06-13 ENCOUNTER — Ambulatory Visit: Payer: Medicare HMO | Admitting: Internal Medicine

## 2022-06-13 VITALS — BP 134/83 | HR 62 | Ht 66.5 in | Wt 273.0 lb

## 2022-06-13 DIAGNOSIS — M7989 Other specified soft tissue disorders: Secondary | ICD-10-CM | POA: Diagnosis not present

## 2022-06-13 DIAGNOSIS — I10 Essential (primary) hypertension: Secondary | ICD-10-CM | POA: Diagnosis not present

## 2022-06-13 DIAGNOSIS — I119 Hypertensive heart disease without heart failure: Secondary | ICD-10-CM | POA: Diagnosis not present

## 2022-06-13 MED ORDER — LOSARTAN POTASSIUM 100 MG PO TABS: 100.0000 mg | ORAL_TABLET | Freq: Every day | ORAL | 3 refills | Status: DC

## 2022-06-13 MED ORDER — POTASSIUM CHLORIDE CRYS ER 10 MEQ PO TBCR: 10.0000 meq | EXTENDED_RELEASE_TABLET | Freq: Every day | ORAL | 3 refills | Status: DC | PRN

## 2022-06-13 NOTE — Telephone Encounter (Signed)
Patient home blood pressure is elevated. Patient has only 8 days of readings this month and only recently started the increased dose of losartan (50mg  to 100mg ) on 3/18. Patient repeat labs resulted on 3/26.  Systolic Blood Pressure mmHg -- 151.3 (111.0 - [id number])  Diastolic Blood Pressure mmHg -- 96.4 (76.0 - 105.0)  Heart Rate bpm -- 70.7 (59.0 - 82.0)  06/11/22 6:58 PM 137 / 85 mmHg 71 bpm  06/06/22 6:36 PM 111 / 80 mmHg 71 bpm  06/06/22 3:45 PM 112 / 76 mmHg 67 bpm  06/02/22 9:47 AM 147 / 90 mmHg 74 bpm  05/30/22 6:22 PM 163 / 102 mmHg 74 bpm  05/29/22 9:18 PM 127 / 92 mmHg 71 bpm  05/26/22 9:24 PM 160 / 105 mmHg 82 bpm  05/22/22 10:46 AM 123 / 88 mmHg 59 bpm  05/22/22 4:40 AM 155 / 102 mmHg 72 bpm

## 2022-06-13 NOTE — Progress Notes (Signed)
Primary Physician/Referring:  Amelia Jo, PA  Patient ID: Olivia Gutierrez, female    DOB: 08-26-1955, 67 y.o.   MRN: [id number]  Chief Complaint  Patient presents with   Palpitations   Follow-up   Medication Refill   HPI:    Olivia Gutierrez  is a 67 y.o. female with past medical history significant for hypertension, asthma, and GERD who is here for a follow-up visit. Patient has been doing well since the last time she was here. She is feeling well on the increased dose of losartan and she had her labs checked yesterday. She is not having any side effects and she feels overall much better now that her blood pressure is normalized. No new concerns or complaints. Patient is working on losing weight and hopes to be able to decrease some of her BP meds once she has a good amount of weight loss. She denies chest pain, diaphoresis, syncope, claudication, PND.  Past Medical History:  Diagnosis Date   Anemia    Arthritis    Asthma    Diverticulosis    GERD (gastroesophageal reflux disease)    Hyperlipidemia    Hypertension    Morbid obesity ([place])    Past Surgical History:  Procedure Laterality Date   WISDOM TOOTH EXTRACTION     Family History  Problem Relation Age of Onset   Heart attack Mother    Stroke Father    Lung disease Father    Heart failure Sister    Heart attack Sister    Cystic fibrosis Sister        x 4   Colon cancer Sister    Colon cancer Brother    Stomach cancer Neg Hx    Pancreatic cancer Neg Hx    Esophageal cancer Neg Hx     Social History   Tobacco Use   Smoking status: Never   Smokeless tobacco: Never  Substance Use Topics   Alcohol use: Yes    Comment: occ glass of wine   Marital Status: Married  ROS  Review of Systems  Cardiovascular:  Negative for dyspnea on exertion, leg swelling, orthopnea and palpitations.  Respiratory:  Negative for shortness of breath.    Objective  Blood pressure 134/83, pulse 62, height 5' 6.5"  (1.689 m), weight 273 lb (123.8 kg), SpO2 100 %. Body mass index is 43.4 kg/m.     06/13/2022    9:07 AM 03/26/2022    8:44 AM 02/26/2022    9:02 AM  Vitals with BMI  Height 5' 6.5" 5' 6.5"   Weight 273 lbs 272 lbs   BMI [id number]   Systolic [id number]  Diastolic 83 91 [id number]  Pulse 62 66 58     Physical Exam Vitals reviewed.  HENT:     Head: Normocephalic and atraumatic.  Cardiovascular:     Rate and Rhythm: Normal rate and regular rhythm.     Pulses: Normal pulses.     Heart sounds: Normal heart sounds. No murmur heard. Pulmonary:     Effort: Pulmonary effort is normal.  Abdominal:     General: Bowel sounds are normal.  Musculoskeletal:     Right lower leg: No edema.     Left lower leg: No edema.  Skin:    General: Skin is warm and dry.  Neurological:     Mental Status: She is alert.     Medications and allergies   Allergies  Allergen Reactions   Other Shortness Of Breath and Itching  Certain peanuts.. Pecans, walnuts   Penicillins Hives and Rash    Has patient had a PCN reaction causing immediate rash, facial/tongue/throat swelling, SOB or lightheadedness with hypotension:unknown Has patient had a PCN reaction causing severe rash involving mucus membranes or skin necrosis: unknown Has patient had a PCN reaction that required hospitalization : yes Has patient had a PCN reaction occurring within the last 10 years: no, childhood allergy If all of the above answers are "NO", then may proceed with Cephalosporin use.      Medication list after today's encounter   Current Outpatient Medications:    albuterol (VENTOLIN HFA) 108 (90 Base) MCG/ACT inhaler, Inhale 2 puffs into the lungs every 6 (six) hours as needed for wheezing or shortness of breath., Disp: , Rfl:    bumetanide (BUMEX) 2 MG tablet, Take 1 tablet (2 mg total) by mouth daily. (Patient taking differently: Take 2 mg by mouth as needed.), Disp: 30 tablet, Rfl: 2   cholecalciferol (VITAMIN D3) 25 MCG (1000  UNIT) tablet, Take 1,000 Units by mouth daily., Disp: , Rfl:    hydrochlorothiazide (HYDRODIURIL) 25 MG tablet, Take 25 mg by mouth daily., Disp: , Rfl:    Multiple Vitamins-Minerals (MULTIVITAMIN ADULTS) TABS, Take 1 tablet by mouth daily., Disp: , Rfl:    omeprazole (PRILOSEC) 40 MG capsule, Take 1 capsule (40 mg total) by mouth in the morning and at bedtime., Disp: 90 capsule, Rfl: 7   potassium chloride (KLOR-CON M) 10 MEQ tablet, Take 1 tablet (10 mEq total) by mouth daily as needed., Disp: 30 tablet, Rfl: 3   vitamin C (ASCORBIC ACID) 500 MG tablet, Take 500 mg by mouth daily., Disp: , Rfl:    losartan (COZAAR) 100 MG tablet, Take 1 tablet (100 mg total) by mouth daily., Disp: 90 tablet, Rfl: 3  Laboratory examination:   Lab Results  Component Value Date   NA 143 06/11/2022   K 4.1 06/11/2022   CO2 25 06/11/2022   GLUCOSE 94 06/11/2022   BUN 20 06/11/2022   CREATININE 0.88 06/11/2022   CALCIUM 9.9 06/11/2022   EGFR 73 06/11/2022   GFRNONAA >60 11/14/2020       Latest Ref Rng & Units 06/11/2022    2:37 PM 02/26/2022    1:18 PM 11/14/2020   10:15 AM  CMP  Glucose 70 - 99 mg/dL 94  87  105   BUN 8 - 27 mg/dL 20  13  11    Creatinine 0.57 - 1.00 mg/dL 0.88  0.88  0.89   Sodium 134 - 144 mmol/L 143  141  137   Potassium 3.5 - 5.2 mmol/L 4.1  3.9  3.7   Chloride 96 - 106 mmol/L 103  103  103   CO2 20 - 29 mmol/L 25  25  25    Calcium 8.7 - 10.3 mg/dL 9.9  9.5  9.5   Total Protein 6.0 - 8.5 g/dL 6.4   6.2   Total Bilirubin 0.0 - 1.2 mg/dL 0.3   0.8   Alkaline Phos 44 - 121 IU/L 81   62   AST 0 - 40 IU/L 17   15   ALT 0 - 32 IU/L 13   12       Latest Ref Rng & Units 11/14/2020   10:15 AM 10/26/2020    5:36 PM 05/20/2019   11:20 AM  CBC  WBC 4.0 - 10.5 K/uL 7.9  5.2  5.7   Hemoglobin 12.0 - 15.0 g/dL 11.7  11.9  12.0   Hematocrit 36.0 - 46.0 % 37.0  38.0  37.3   Platelets 150 - 400 K/uL 298  287  307     Lipid Panel No results for input(s): "CHOL", "TRIG", "LDLCALC",  "VLDL", "HDL", "CHOLHDL", "LDLDIRECT" in the last 8760 hours.  HEMOGLOBIN A1C Lab Results  Component Value Date   HGBA1C 5.3 05/20/2019   TSH No results for input(s): "TSH" in the last 8760 hours.  External labs:     Radiology:    Cardiac Studies:   Echocardiogram 02/28/2022: Normal LV systolic function with visual EF 60-65%. Left ventricle cavity is normal in size. Moderate left ventricular hypertrophy. Normal global wall motion. Normal diastolic filling pattern, normal LAP. Left atrial cavity is mildly dilated at 41.68 ml/m^2. Mild tricuspid regurgitation. No evidence of pulmonary hypertension. No prior study for comparison.     EKG:   02/26/2022: normal sinus rhythm with iRBBB. Diffuse T wave inversions.  Assessment     ICD-10-CM   1. Essential hypertension  I10     2. Hypertensive heart disease without heart failure  I11.9     3. Leg swelling  M79.89        No orders of the defined types were placed in this encounter.   Meds ordered this encounter  Medications   losartan (COZAAR) 100 MG tablet    Sig: Take 1 tablet (100 mg total) by mouth daily.    Dispense:  90 tablet    Refill:  3   potassium chloride (KLOR-CON M) 10 MEQ tablet    Sig: Take 1 tablet (10 mEq total) by mouth daily as needed.    Dispense:  30 tablet    Refill:  3    Medications Discontinued During This Encounter  Medication Reason   methocarbamol (ROBAXIN) 750 MG tablet Completed Course   losartan (COZAAR) 50 MG tablet Dose change   losartan (COZAAR) 100 MG tablet Reorder     Recommendations:   Olivia Gutierrez is a 67 y.o.  female with hypertension and bilateral leg edema  Leg swelling Bumex being taken PRN  No further edema reported   Essential hypertension Hypertensive heart disease without heart failure Continue current cardiac medications. Tolerating Losartan dose without side effects Creatinine has remained stable Encourage low-sodium diet, less than 2000  mg daily. Follow-up in 6 months or sooner if needed     Olivia Flock, DO, [facility]  06/13/2022, 10:26 AM Office: [phone] Pager: [phone]

## 2022-06-22 DIAGNOSIS — I10 Essential (primary) hypertension: Secondary | ICD-10-CM | POA: Diagnosis not present

## 2022-08-21 DIAGNOSIS — I10 Essential (primary) hypertension: Secondary | ICD-10-CM | POA: Diagnosis not present

## 2022-09-16 DIAGNOSIS — Z79899 Other long term (current) drug therapy: Secondary | ICD-10-CM | POA: Diagnosis not present

## 2022-09-27 DIAGNOSIS — Z79899 Other long term (current) drug therapy: Secondary | ICD-10-CM | POA: Diagnosis not present

## 2022-10-02 ENCOUNTER — Other Ambulatory Visit: Payer: Self-pay | Admitting: Family

## 2022-10-02 DIAGNOSIS — Z1231 Encounter for screening mammogram for malignant neoplasm of breast: Secondary | ICD-10-CM

## 2022-10-02 DIAGNOSIS — K449 Diaphragmatic hernia without obstruction or gangrene: Secondary | ICD-10-CM

## 2022-10-02 DIAGNOSIS — R1310 Dysphagia, unspecified: Secondary | ICD-10-CM

## 2022-10-02 DIAGNOSIS — K219 Gastro-esophageal reflux disease without esophagitis: Secondary | ICD-10-CM

## 2022-10-15 ENCOUNTER — Other Ambulatory Visit: Payer: No Typology Code available for payment source

## 2022-10-18 DIAGNOSIS — R92323 Mammographic fibroglandular density, bilateral breasts: Secondary | ICD-10-CM | POA: Diagnosis not present

## 2022-10-18 DIAGNOSIS — Z1231 Encounter for screening mammogram for malignant neoplasm of breast: Secondary | ICD-10-CM | POA: Diagnosis not present

## 2022-10-28 ENCOUNTER — Ambulatory Visit: Payer: Medicare HMO | Admitting: Cardiology

## 2022-10-28 ENCOUNTER — Other Ambulatory Visit: Payer: Self-pay | Admitting: Cardiology

## 2022-10-28 ENCOUNTER — Encounter: Payer: Self-pay | Admitting: Cardiology

## 2022-10-28 VITALS — BP 160/95 | Resp 16 | Ht 66.0 in | Wt 269.0 lb

## 2022-10-28 DIAGNOSIS — Z6841 Body Mass Index (BMI) 40.0 and over, adult: Secondary | ICD-10-CM

## 2022-10-28 DIAGNOSIS — I10 Essential (primary) hypertension: Secondary | ICD-10-CM | POA: Diagnosis not present

## 2022-10-28 DIAGNOSIS — I119 Hypertensive heart disease without heart failure: Secondary | ICD-10-CM | POA: Diagnosis not present

## 2022-10-28 DIAGNOSIS — E66813 Obesity, class 3: Secondary | ICD-10-CM

## 2022-10-28 MED ORDER — SPIRONOLACTONE 25 MG PO TABS: 25.0000 mg | ORAL_TABLET | ORAL | 2 refills | Status: DC

## 2022-10-28 MED ORDER — WEGOVY 0.25 MG/0.5ML ~~LOC~~ SOAJ
0.2500 mg | SUBCUTANEOUS | 2 refills | Status: DC
Start: 2022-10-28 — End: 2022-10-29

## 2022-10-28 MED ORDER — OLMESARTAN MEDOXOMIL-HCTZ 40-25 MG PO TABS
1.0000 | ORAL_TABLET | Freq: Every day | ORAL | 3 refills | Status: DC
Start: 2022-10-28 — End: 2023-02-17

## 2022-10-28 NOTE — Progress Notes (Signed)
Primary Physician/Referring:  Olivia Crofts, FNP  Patient ID: Olivia Gutierrez, female    DOB: [birth year], 67 y.o.   MRN: [id number]  Chief Complaint  Patient presents with   Hypertension   Follow-up    6 months   HPI:    [name]  is a 67 y.o. past medical history significant for hypertension, reactive airway disease, morbid obesity, hypercholesterolemia and GERD. She is asymptomatic. No chest pain or dyspnea. No dizziness or syncope.   Patient is here on a 71-month office visit, she is asymptomatic.  Past Medical History:  Diagnosis Date   Anemia    Arthritis    Asthma    Diverticulosis    GERD (gastroesophageal reflux disease)    Hyperlipidemia    Hypertension    Morbid obesity ([facility])    Past Surgical History:  Procedure Laterality Date   WISDOM TOOTH EXTRACTION     Family History  Problem Relation Age of Onset   Heart attack Mother    Stroke Father    Lung disease Father    Heart failure Sister    Heart attack Sister    Cystic fibrosis Sister        x 4   Colon cancer Sister    Colon cancer Brother    Stomach cancer Neg Hx    Pancreatic cancer Neg Hx    Esophageal cancer Neg Hx     Social History   Tobacco Use   Smoking status: Never   Smokeless tobacco: Never  Substance Use Topics   Alcohol use: Yes    Comment: occ glass of wine   Marital Status: Widowed  ROS  Review of Systems  Cardiovascular:  Positive for palpitations (occasional). Negative for chest pain, dyspnea on exertion and leg swelling.   Objective      10/28/2022    1:16 PM 06/13/2022    9:07 AM 03/26/2022    8:44 AM  Vitals with BMI  Height 5\' 6"  5' 6.5" 5' 6.5"  Weight 269 lbs 273 lbs 272 lbs  BMI 43.44 43.41 43.25  Systolic 160 134 784  Diastolic 95 83 91  Pulse  62 66   Blood pressure (!) 160/95, resp. rate 16, height 5\' 6"  (1.676 m), weight 269 lb (122 kg), SpO2 98%.  Physical Exam Constitutional:      Appearance: She is morbidly obese.  Neck:      Vascular: No carotid bruit or JVD.  Cardiovascular:     Rate and Rhythm: Normal rate and regular rhythm.     Pulses: Intact distal pulses.     Heart sounds: Normal heart sounds. No murmur heard.    No gallop.  Pulmonary:     Effort: Pulmonary effort is normal.     Breath sounds: Normal breath sounds.  Abdominal:     General: Bowel sounds are normal.     Palpations: Abdomen is soft.  Musculoskeletal:     Right lower leg: No edema.     Left lower leg: No edema.    Laboratory examination:   Recent Labs    02/26/22 1318 06/11/22 1437  NA 141 143  K 3.9 4.1  CL 103 103  CO2 25 25  GLUCOSE 87 94  BUN 13 20  CREATININE 0.88 0.88  CALCIUM 9.5 9.9    Lab Results  Component Value Date   GLUCOSE 94 06/11/2022   NA 143 06/11/2022   K 4.1 06/11/2022   CL 103 06/11/2022   CO2 25 06/11/2022  BUN 20 06/11/2022   CREATININE 0.88 06/11/2022   EGFR 73 06/11/2022   CALCIUM 9.9 06/11/2022   PROT 6.4 06/11/2022   ALBUMIN 4.2 06/11/2022   LABGLOB 2.2 06/11/2022   AGRATIO 1.9 06/11/2022   BILITOT 0.3 06/11/2022   ALKPHOS 81 06/11/2022   AST 17 06/11/2022   ALT 13 06/11/2022   ANIONGAP 9 11/14/2020      Lab Results  Component Value Date   ALT 13 06/11/2022   AST 17 06/11/2022   ALKPHOS 81 06/11/2022   BILITOT 0.3 06/11/2022       Latest Ref Rng & Units 11/14/2020   10:15 AM 10/26/2020    5:36 PM 05/20/2019   11:20 AM  CBC  WBC 4.0 - 10.5 K/uL 7.9  5.2  5.7   Hemoglobin 12.0 - 15.0 g/dL 40.9  81.1  91.4   Hematocrit 36.0 - 46.0 % 37.0  38.0  37.3   Platelets 150 - 400 K/uL 298  287  307        Latest Ref Rng & Units 06/11/2022    2:37 PM 11/14/2020   10:15 AM 05/20/2019   11:20 AM  Hepatic Function  Total Protein 6.0 - 8.5 g/dL 6.4  6.2  6.2   Albumin 3.9 - 4.9 g/dL 4.2  3.1  4.0   AST 0 - 40 IU/L 17  15  8    ALT 0 - 32 IU/L 13  12  8    Alk Phosphatase 44 - 121 IU/L 81  62  90   Total Bilirubin 0.0 - 1.2 mg/dL 0.3  0.8  <7.8    HEMOGLOBIN A1C Lab Results   Component Value Date   HGBA1C 5.3 05/20/2019   TSH No results for input(s): "TSH" in the last 8760 hours.  External labs:   Labs 12/03/2021:  Total cholesterol 213, triglycerides 91, HDL 69, LDL 128.  A1c 5.9%.  TSH 2.050 05/20/2019  Radiology:    Cardiac Studies:   Echocardiogram 02/28/2022: Normal LV systolic function with visual EF 60-65%. Left ventricle cavity is normal in size. Moderate left ventricular hypertrophy. Normal global wall motion. Normal diastolic filling pattern, normal LAP. Left atrial cavity is mildly dilated at 41.68 ml/m^2. Mild tricuspid regurgitation. No evidence of pulmonary hypertension. No prior study for comparison.  EKG:   EKG 10/28/2022: Normal sinus rhythm at the rate of 65 bpm, normal axis, incomplete right bundle branch block.  LVH with repolarization abnormality, cannot exclude anterolateral ischemia.  Compared to 02/26/2022, T wave inversion slightly more prominent in the lateral leads.  Medications and allergies   Allergies  Allergen Reactions   Other Shortness Of Breath and Itching    Certain peanuts.. Pecans, walnuts   Penicillins Hives and Rash    Has patient had a PCN reaction causing immediate rash, facial/tongue/throat swelling, SOB or lightheadedness with hypotension:unknown Has patient had a PCN reaction causing severe rash involving mucus membranes or skin necrosis: unknown Has patient had a PCN reaction that required hospitalization : yes Has patient had a PCN reaction occurring within the last 10 years: no, childhood allergy If all of the above answers are "NO", then may proceed with Cephalosporin use.     Medication list   Current Outpatient Medications:    albuterol (VENTOLIN HFA) 108 (90 Base) MCG/ACT inhaler, Inhale 2 puffs into the lungs every 6 (six) hours as needed for wheezing or shortness of breath., Disp: , Rfl:    cholecalciferol (VITAMIN D3) 25 MCG (1000 UNIT) tablet, Take 1,000 Units by  mouth daily., Disp: ,  Rfl:    Multiple Vitamins-Minerals (MULTIVITAMIN ADULTS) TABS, Take 1 tablet by mouth daily., Disp: , Rfl:    olmesartan-hydrochlorothiazide (BENICAR HCT) 40-25 MG tablet, Take 1 tablet by mouth daily., Disp: 90 tablet, Rfl: 3   omeprazole (PRILOSEC) 40 MG capsule, Take 1 capsule (40 mg total) by mouth in the morning and at bedtime., Disp: 90 capsule, Rfl: 7   Semaglutide-Weight Management (WEGOVY) 0.25 MG/0.5ML SOAJ, Inject 0.25 mg into the skin once a week., Disp: 2 mL, Rfl: 2   spironolactone (ALDACTONE) 25 MG tablet, Take 1 tablet (25 mg total) by mouth every morning., Disp: 30 tablet, Rfl: 2   vitamin C (ASCORBIC ACID) 500 MG tablet, Take 500 mg by mouth daily., Disp: , Rfl:   Assessment     ICD-10-CM   1. Essential hypertension  I10 EKG 12-Lead    olmesartan-hydrochlorothiazide (BENICAR HCT) 40-25 MG tablet    spironolactone (ALDACTONE) 25 MG tablet    Basic metabolic panel    2. Hypertensive heart disease without heart failure  I11.9 olmesartan-hydrochlorothiazide (BENICAR HCT) 40-25 MG tablet    spironolactone (ALDACTONE) 25 MG tablet    Semaglutide-Weight Management (WEGOVY) 0.25 MG/0.5ML SOAJ    3. Class 3 severe obesity due to excess calories with serious comorbidity and body mass index (BMI) of 40.0 to 44.9 in adult (HCC)  E66.01 Semaglutide-Weight Management (WEGOVY) 0.25 MG/0.5ML SOAJ   Z68.41        Orders Placed This Encounter  Procedures   Basic metabolic panel   EKG 12-Lead   Meds ordered this encounter  Medications   olmesartan-hydrochlorothiazide (BENICAR HCT) 40-25 MG tablet    Sig: Take 1 tablet by mouth daily.    Dispense:  90 tablet    Refill:  3    Discontinue losartan and hydrochlorothiazide   spironolactone (ALDACTONE) 25 MG tablet    Sig: Take 1 tablet (25 mg total) by mouth every morning.    Dispense:  30 tablet    Refill:  2   Semaglutide-Weight Management (WEGOVY) 0.25 MG/0.5ML SOAJ    Sig: Inject 0.25 mg into the skin once a week.    Dispense:   2 mL    Refill:  2   Medications Discontinued During This Encounter  Medication Reason   hydrochlorothiazide (HYDRODIURIL) 25 MG tablet Change in therapy   losartan (COZAAR) 100 MG tablet Change in therapy   bumetanide (BUMEX) 2 MG tablet Discontinued by provider   potassium chloride (KLOR-CON M) 10 MEQ tablet Discontinued by provider     Recommendations:   Averie Devenney is a 67 y.o. past medical history significant for hypertension, reactive airway disease, morbid obesity, hypercholesterolemia and GERD. She is asymptomatic. No chest pain or dyspnea. No dizziness or syncope.   1. Essential hypertension Patient's blood pressure is markedly elevated.  I have discontinued losartan 100 mg daily and also hydrochlorothiazide 25 mg daily and switched her to Benicar HCT 40/25 mg in the morning.  Will also add spironolactone in the morning.  I will discontinue Bumex and also potassium supplements as I am also starting the patient on Wegovy for weight loss as well.  - EKG 12-Lead - olmesartan-hydrochlorothiazide (BENICAR HCT) 40-25 MG tablet; Take 1 tablet by mouth daily.  Dispense: 90 tablet; Refill: 3 - spironolactone (ALDACTONE) 25 MG tablet; Take 1 tablet (25 mg total) by mouth every morning.  Dispense: 30 tablet; Refill: 2 - Basic metabolic panel  2. Hypertensive heart disease without heart failure I have discussed with  her regarding the importance of blood pressure control, dietary changes that need to be made.  No symptoms to suggest sleep apnea.  I reviewed her prior CT angiogram of the abdomen done several years ago, no mention of renal atherosclerosis or renal artery stenosis.  Suspect her hypertension is related to poor dietary habits and morbid obesity.  - olmesartan-hydrochlorothiazide (BENICAR HCT) 40-25 MG tablet; Take 1 tablet by mouth daily.  Dispense: 90 tablet; Refill: 3 - spironolactone (ALDACTONE) 25 MG tablet; Take 1 tablet (25 mg total) by mouth every morning.   Dispense: 30 tablet; Refill: 2 - Semaglutide-Weight Management (WEGOVY) 0.25 MG/0.5ML SOAJ; Inject 0.25 mg into the skin once a week.  Dispense: 2 mL; Refill: 2  3. Class 3 severe obesity due to excess calories with serious comorbidity and body mass index (BMI) of 40.0 to 44.9 in adult [facility]) I would like to try [facility] for weight loss management.  Extensive discussion regarding slow eating, small meals and avoiding high fatty meals and to keep herself well-hydrated.  I will see her back in 6 weeks for follow-up.  BMP in 3 to 4 weeks  - Semaglutide-Weight Management (WEGOVY) 0.25 MG/0.5ML SOAJ; Inject 0.25 mg into the skin once a week.  Dispense: 2 mL; Refill: 2     Yates Decamp, MD, [facility] 10/28/2022, 2:05 PM Office: [phone]

## 2022-10-29 ENCOUNTER — Other Ambulatory Visit: Payer: Self-pay | Admitting: Cardiology

## 2022-10-29 DIAGNOSIS — I119 Hypertensive heart disease without heart failure: Secondary | ICD-10-CM

## 2022-10-29 DIAGNOSIS — E66813 Obesity, class 3: Secondary | ICD-10-CM

## 2022-12-09 ENCOUNTER — Ambulatory Visit: Payer: Medicare HMO | Admitting: Cardiology

## 2022-12-17 ENCOUNTER — Other Ambulatory Visit (INDEPENDENT_AMBULATORY_CARE_PROVIDER_SITE_OTHER): Payer: Self-pay

## 2022-12-17 ENCOUNTER — Ambulatory Visit: Payer: Medicare HMO | Admitting: Physician Assistant

## 2022-12-17 ENCOUNTER — Encounter: Payer: Self-pay | Admitting: Physician Assistant

## 2022-12-17 DIAGNOSIS — M5441 Lumbago with sciatica, right side: Secondary | ICD-10-CM

## 2022-12-17 DIAGNOSIS — M545 Low back pain, unspecified: Secondary | ICD-10-CM | POA: Insufficient documentation
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
## 2022-12-19 ENCOUNTER — Telehealth: Payer: Self-pay | Admitting: Cardiology

## 2022-12-19 NOTE — Telephone Encounter (Signed)
Left message for patient to call back  
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
## 2022-12-19 NOTE — Telephone Encounter (Signed)
Pt c/o medication issue:  1. Name of Medication:   spironolactone (ALDACTONE) 25 MG tablet  olmesartan-hydrochlorothiazide (BENICAR HCT) 40-25 MG tablet   2. How are you currently taking this medication (dosage and times per day)?  Not taking  3. Are you having a reaction (difficulty breathing--STAT)?   Passing out  4. What is your medication issue?   Patient stated she stopped taking this medication as she was passing out. Patient stated she went back to taking her old medication.  Patient wants a call back on next steps.

## 2022-12-20 ENCOUNTER — Ambulatory Visit: Payer: Medicare HMO | Attending: Physician Assistant

## 2022-12-20 ENCOUNTER — Other Ambulatory Visit: Payer: Self-pay

## 2022-12-20 DIAGNOSIS — M62838 Other muscle spasm: Secondary | ICD-10-CM | POA: Diagnosis not present

## 2022-12-20 DIAGNOSIS — M533 Sacrococcygeal disorders, not elsewhere classified: Secondary | ICD-10-CM

## 2022-12-20 DIAGNOSIS — M5441 Lumbago with sciatica, right side: Secondary | ICD-10-CM | POA: Insufficient documentation

## 2022-12-20 DIAGNOSIS — M5459 Other low back pain: Secondary | ICD-10-CM

## 2022-12-31 NOTE — Telephone Encounter (Signed)
Okay. She can follow up with her PCP. Nalaxone and wellbutrin may be an option

## 2022-12-31 NOTE — Telephone Encounter (Signed)
Patient on medicare, will not pay for [facility] for weight loss indication and does not appear she has CVD. Therefore would not be covered. Patient does not have DM.

## 2022-12-31 NOTE — Telephone Encounter (Addendum)
Pt returned to her old medications Hydrochlorothiazide & Losartan -- states she has had no issues since switching back. She is asking for refills for those -- aware I will need to send to MD for review/approval/anything additional. (She did not get the BMET d/t not taking the newer medications)  States she has not received her Reginal Lutes that was ordered several months ago.  She said she thinks it was denied.  Aware forwarding to pharmD for input on this.  Forwarding to schedulers to arrange sooner OV for pt being that tx plan has changed -- she should not wait until December to see someone... Pt aware office will call back to r/s appt

## 2023-01-01 NOTE — Telephone Encounter (Signed)
Spoke with patient and she is aware of pharmD and provider recommendations. Per her request also sent my chart message with provider recommendations. She also stated she was supposed to have follow up with Dr. Jacinto Halim in October but because of the switch he appointment was canceled. She did not want to see PA. Rescheduled with Dr. Jacinto Halim

## 2023-01-03 ENCOUNTER — Encounter: Payer: Self-pay | Admitting: *Deleted

## 2023-01-28 ENCOUNTER — Other Ambulatory Visit: Payer: Self-pay | Admitting: Cardiology

## 2023-01-28 DIAGNOSIS — I119 Hypertensive heart disease without heart failure: Secondary | ICD-10-CM

## 2023-01-28 DIAGNOSIS — I10 Essential (primary) hypertension: Secondary | ICD-10-CM

## 2023-02-12 ENCOUNTER — Ambulatory Visit: Payer: Medicare HMO | Admitting: Cardiology

## 2023-02-16 ENCOUNTER — Encounter: Payer: Self-pay | Admitting: Cardiology

## 2023-02-16 NOTE — Progress Notes (Unsigned)
Cardiology Office Note:  .   Date:  02/17/2023  ID:  Olivia Gutierrez, DOB 07-Apr-1955, MRN [id number] PCP: Vladimir Crofts, FNP  [facility] Providers Cardiologist:  Yates Decamp, MD   History of Present Illness: .   Olivia Gutierrez is a 67 y.o. [facility] patient with  hypertension, reactive airway disease, morbid obesity, hypercholesterolemia and GERD. She is asymptomatic.  Discussed the use of AI scribe software for clinical note transcription with the patient, who gave verbal consent to proceed.  History of Present Illness   The patient, with a history of hypertension, presents for a routine visit. She reports a recent change in her blood pressure medication due to side effects, including fainting spells. The patient has since returned to her previous medication and reports that her blood pressure is now stable. She also expresses a desire to try a weight loss medication, Wegovy, but it was not approved by her insurance. The patient is currently not taking any medication for weight loss. 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 The patient also reports a recent increase in appetite and a tendency to eat junk food, which she attributes to a period of unemployment and associated stress. She expresses a desire to improve her eating habits.        Review of Systems  Cardiovascular:  Negative for chest pain, dyspnea on exertion and leg swelling.    Labs   External Labs:  Labs 12/03/2021:   Total cholesterol 213, triglycerides 91, HDL 69, LDL 128.   A1c 5.9%.   TSH 2.050 05/20/2019  Physical Exam:   VS:  BP 130/80 (BP Location: Right Arm, Patient Position: Sitting, Cuff Size: Large)   Pulse 68   Resp 14   Ht 5\' 6"  (1.676 m)   Wt 268 lb 3.2 oz (121.7 kg)   SpO2 93%   BMI 43.29 kg/m    Wt Readings from Last 3 Encounters:   02/17/23 268 lb 3.2 oz (121.7 kg)  10/28/22 269 lb (122 kg)  06/13/22 273 lb (123.8 kg)     Physical Exam Neck:     Vascular: No carotid bruit or JVD.  Cardiovascular:     Rate and Rhythm: Normal rate and regular rhythm.     Pulses: Intact distal pulses.     Heart sounds: Normal heart sounds. No murmur heard.    No gallop.  Pulmonary:     Effort: Pulmonary effort is normal.     Breath sounds: Normal breath sounds.  Abdominal:     General: Bowel sounds are normal.     Palpations: Abdomen is soft.  Musculoskeletal:     Right lower leg: No edema.     Left lower leg: No edema.     Studies Reviewed: .    Echocardiogram 02/28/2022: Normal LV systolic function with visual EF 60-65%. Left ventricle cavity is normal in size. Moderate left ventricular hypertrophy. Normal global wall motion. Normal diastolic filling pattern, normal LAP. Left atrial cavity is mildly dilated at 41.68 ml/m^2. Mild tricuspid regurgitation. No evidence of pulmonary hypertension. No prior study for comparison.  EKG:    EKG 10/28/2022: Normal sinus rhythm at the rate of 65 bpm, normal axis, incomplete right bundle branch block.  LVH with repolarization abnormality, cannot exclude anterolateral ischemia. Medications and allergies    Allergies  Allergen Reactions   Other Shortness Of Breath  and Itching    Certain peanuts.. Pecans, walnuts   Penicillins Hives and Rash    Has patient had a PCN reaction causing immediate rash, facial/tongue/throat swelling, SOB or lightheadedness with hypotension:unknown Has patient had a PCN reaction causing severe rash involving mucus membranes or skin necrosis: unknown Has patient had a PCN reaction that required hospitalization : yes Has patient had a PCN reaction occurring within the last 10 years: no, childhood allergy If all of the above answers are "NO", then may proceed with Cephalosporin use.      Current Outpatient Medications:    albuterol (VENTOLIN HFA)  108 (90 Base) MCG/ACT inhaler, Inhale 2 puffs into the lungs every 6 (six) hours as needed for wheezing or shortness of breath., Disp: , Rfl:    cholecalciferol (VITAMIN D3) 25 MCG (1000 UNIT) tablet, Take 1,000 Units by mouth daily., Disp: , Rfl:    losartan (COZAAR) 100 MG tablet, Take 100 mg by mouth daily., Disp: , Rfl:    Multiple Vitamins-Minerals (MULTIVITAMIN ADULTS) TABS, Take 1 tablet by mouth daily., Disp: , Rfl:    omeprazole (PRILOSEC) 40 MG capsule, Take 1 capsule (40 mg total) by mouth in the morning and at bedtime., Disp: 90 capsule, Rfl: 7   tiZANidine (ZANAFLEX) 2 MG tablet, Take 2 mg by mouth every 6 (six) hours as needed., Disp: , Rfl:    vitamin C (ASCORBIC ACID) 500 MG tablet, Take 500 mg by mouth daily., Disp: , Rfl:    spironolactone (ALDACTONE) 25 MG tablet, Take 25 mg by mouth daily. (Patient not taking: Reported on 02/17/2023), Disp: , Rfl:    ASSESSMENT AND PLAN: .      ICD-10-CM   1. Hypertensive heart disease without heart failure  I11.9     2. Class 3 severe obesity due to excess calories with serious comorbidity and body mass index (BMI) of 40.0 to 44.9 in adult [facility]  W29.562    E66.01    Z68.41      Assessment and Plan    Hypertension Patient experienced syncope after a change in antihypertensive medication. She has since returned to her previous regimen and reports stable blood pressure. Unclear on the exact medications and doses she is currently taking. -Request patient to send a list of her current medications via MyChart for review and confirmation. -Continue current antihypertensive regimen as it appears to be effective and well-tolerated.  Weight Management Patient expresses desire for weight loss and interest in [place], but insurance did not approve it. She reports increased appetite and consumption of junk food, which she attributes to recent stressors. -Recommend considering a trial of a combination of Naltrexone and Wellbutrin for appetite  suppression and weight loss. Will send a note to the primary care provider regarding this suggestion. -Encourage patient to reduce consumption of junk food and maintain regular physical activity. -She can follow up with her PCP. Nalaxone and wellbutrin may be an option   Dysphagia Patient reports problems with swallowing. No further assessment or plan discussed in the conversation. -Recommend patient to discuss this issue with her primary care provider for further evaluation.  General Health Maintenance / Followup Plans -Release patient back to the care of her primary care provider as there are no current cardiac concerns. -Request patient to send a list of her current medications via MyChart for review and confirmation. -Will send a note to the primary care provider suggesting consideration of a combination of Naltrexone and Wellbutrin for weight loss.     Signed,  Yates Decamp, MD, [facility] 02/17/2023, 4:19 PM [facility] [address] [place], [place] [address] Phone: [phone]. Fax:  [phone]

## 2023-02-17 ENCOUNTER — Ambulatory Visit: Payer: Medicare HMO | Attending: Cardiology | Admitting: Cardiology

## 2023-02-17 ENCOUNTER — Encounter: Payer: Self-pay | Admitting: Cardiology

## 2023-02-17 VITALS — BP 130/80 | HR 68 | Resp 14 | Ht 66.0 in | Wt 268.2 lb

## 2023-02-17 DIAGNOSIS — Z6841 Body Mass Index (BMI) 40.0 and over, adult: Secondary | ICD-10-CM | POA: Diagnosis not present

## 2023-02-17 DIAGNOSIS — I119 Hypertensive heart disease without heart failure: Secondary | ICD-10-CM | POA: Diagnosis not present

## 2023-02-17 DIAGNOSIS — E66813 Obesity, class 3: Secondary | ICD-10-CM | POA: Diagnosis not present

## 2023-02-17 NOTE — Patient Instructions (Signed)
Medication Instructions:  Your physician recommends that you continue on your current medications as directed. Please refer to the Current Medication list given to you today.  *If you need a refill on your cardiac medications before your next appointment, please call your pharmacy*   Lab Work: none If you have labs (blood work) drawn today and your tests are completely normal, you will receive your results only by: MyChart Message (if you have MyChart) OR A paper copy in the mail If you have any lab test that is abnormal or we need to change your treatment, we will call you to review the results.   Testing/Procedures: none   Follow-Up: At [facility], you and your health needs are our priority.  As part of our continuing mission to provide you with exceptional heart care, we have created designated Provider Care Teams.  These Care Teams include your primary Cardiologist (physician) and Advanced Practice Providers (APPs -  Physician Assistants and Nurse Practitioners) who all work together to provide you with the care you need, when you need it.  We recommend signing up for the patient portal called "MyChart".  Sign up information is provided on this After Visit Summary.  MyChart is used to connect with patients for Virtual Visits (Telemedicine).  Patients are able to view lab/test results, encounter notes, upcoming appointments, etc.  Non-urgent messages can be sent to your provider as well.   To learn more about what you can do with MyChart, go to [email or web address].    Your next appointment:   As needed  Provider:   Yates Decamp, MD     Other Instructions Please send in the list of your current medications through my chart

## 2023-03-14 ENCOUNTER — Ambulatory Visit: Payer: Medicare HMO | Admitting: Cardiology

## 2023-05-06 ENCOUNTER — Other Ambulatory Visit: Payer: Self-pay

## 2023-05-06 MED ORDER — SPIRONOLACTONE 25 MG PO TABS: 25.0000 mg | ORAL_TABLET | Freq: Every day | ORAL | 3 refills | Status: AC

## 2023-05-09 MED ORDER — LOSARTAN POTASSIUM 100 MG PO TABS: 100.0000 mg | ORAL_TABLET | Freq: Every day | ORAL | 3 refills | Status: AC

## 2023-05-09 NOTE — Addendum Note (Signed)
Addended by: Margaret Pyle D on: 05/09/2023 10:49 AM   Modules accepted: Orders

## 2023-05-13 ENCOUNTER — Ambulatory Visit: Payer: Medicare HMO | Admitting: Cardiology

## 2023-09-10 ENCOUNTER — Encounter (HOSPITAL_COMMUNITY)

## 2023-09-10 ENCOUNTER — Ambulatory Visit (HOSPITAL_COMMUNITY)
Admission: RE | Admit: 2023-09-10 | Discharge: 2023-09-10 | Disposition: A | Discharge status: Home / Self Care | Source: Ambulatory Visit | Attending: Vascular Surgery | Admitting: Vascular Surgery

## 2023-09-10 ENCOUNTER — Other Ambulatory Visit (HOSPITAL_COMMUNITY): Payer: Self-pay | Admitting: Physician Assistant

## 2023-09-10 DIAGNOSIS — M79604 Pain in right leg: Secondary | ICD-10-CM | POA: Diagnosis not present

## 2023-09-10 DIAGNOSIS — M79605 Pain in left leg: Secondary | ICD-10-CM | POA: Diagnosis present
